# Patient Record
Sex: Female | Born: 1990 | Hispanic: No | Marital: Married | Smoking: Former smoker
Health system: Southern US, Community
[De-identification: ages and names within clinical notes are randomized; demographics above are authoritative.]

## PROBLEM LIST (undated history)

## (undated) DIAGNOSIS — A64 Unspecified sexually transmitted disease: Secondary | ICD-10-CM

## (undated) DIAGNOSIS — T783XXA Angioneurotic edema, initial encounter: Secondary | ICD-10-CM

## (undated) DIAGNOSIS — L509 Urticaria, unspecified: Secondary | ICD-10-CM

## (undated) HISTORY — PX: WISDOM TOOTH EXTRACTION: SHX21

## (undated) HISTORY — DX: Angioneurotic edema, initial encounter: T78.3XXA

## (undated) HISTORY — DX: Urticaria, unspecified: L50.9

## (undated) HISTORY — DX: Unspecified sexually transmitted disease: A64

---

## 2012-10-18 DIAGNOSIS — A64 Unspecified sexually transmitted disease: Secondary | ICD-10-CM

## 2012-10-18 HISTORY — DX: Unspecified sexually transmitted disease: A64

## 2016-12-13 ENCOUNTER — Encounter: Payer: Self-pay | Admitting: Obstetrics and Gynecology

## 2016-12-13 ENCOUNTER — Ambulatory Visit (INDEPENDENT_AMBULATORY_CARE_PROVIDER_SITE_OTHER): Payer: BLUE CROSS/BLUE SHIELD | Admitting: Obstetrics and Gynecology

## 2016-12-13 VITALS — BP 130/64 | HR 60 | Resp 14 | Ht 60.25 in | Wt 108.4 lb

## 2016-12-13 DIAGNOSIS — Z01419 Encounter for gynecological examination (general) (routine) without abnormal findings: Secondary | ICD-10-CM | POA: Diagnosis not present

## 2016-12-13 NOTE — Progress Notes (Signed)
[age] y.o. 680P0000 Married Caucasian female here for annual exam.    Menses are monthly.  No significant cramping or heavy bleeding.   Had painful intercourse on right side once, two months ago. Lasted for an hour and was very significant. Intercourse can be somewhat uncomfortable at times but not consistently.  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  Took antiviral medication in the past for a very short period of time.   Works for [employer]. Buying a house.   PCP:   Candi LeashAnthony Hayes  Patient's last menstrual period was 11/03/2016.           Sexually active: Yes.    The current method of family planning is condoms all of the time.  Satisfied with this.  Exercising: No.  The patient does not participate in regular exercise at present. Smoker:  Yes.  Would like to quit.   Health Maintenance: Pap:  Patient states she has never had one History of abnormal Pap:  n/a MMG:  never Colonoscopy:  never BMD:   never  Result  n/a TDaP:  Unsure.  Documents are at home.  Gardasil:   No.  Declines.  HIV: unsure Hep C: unsure Screening Labs: PCP Hb today: PCP   reports that she has been smoking Cigarettes.  She has never used smokeless tobacco. She reports that she drinks alcohol. She reports that she does not use drugs.  Past Medical History:  Diagnosis Date  . STD (sexually transmitted disease) 2014   Herpes- has not had outbreak    History reviewed. No pertinent surgical history.  Current Outpatient Prescriptions  Medication Sig Dispense Refill  . Cholecalciferol (VITAMIN D3 PO) Take by mouth daily. 2 gummies a day    . IRON PO Take by mouth daily.     No current facility-administered medications for this visit.     Family History  Problem Relation Age of Onset  . Diabetes Mother   . Kidney Stones Mother   . Diabetes Brother   . Bipolar disorder Maternal Aunt   . Dementia Maternal Grandmother   . Bipolar disorder Brother   . Schizophrenia Cousin     ROS:  Pertinent  items are noted in HPI.  Otherwise, a comprehensive ROS was negative.  Exam:   BP 130/64 (BP Location: Right Arm, Patient Position: Sitting, Cuff Size: Normal)   Pulse 60   Resp 14   Ht 5' 0.25" (1.53 m)   Wt 108 lb 6.4 oz (49.2 kg)   LMP 11/03/2016   BMI 21.00 kg/m     General appearance: alert, cooperative and appears stated age Head: Normocephalic, without obvious abnormality, atraumatic Neck: no adenopathy, supple, symmetrical, trachea midline and thyroid normal to inspection and palpation Lungs: clear to auscultation bilaterally Breasts: normal appearance, no masses or tenderness, No nipple retraction or dimpling, No nipple discharge or bleeding, No axillary or supraclavicular adenopathy Heart: regular rate and rhythm Abdomen: soft, non-tender; no masses, no organomegaly Extremities: extremities normal, atraumatic, no cyanosis or edema Skin: Skin color, texture, turgor normal. No rashes or lesions Lymph nodes: Cervical, supraclavicular, and axillary nodes normal. No abnormal inguinal nodes palpated Neurologic: Grossly normal  Pelvic: External genitalia:  no lesions              Urethra:  normal appearing urethra with no masses, tenderness or lesions              Bartholins and Skenes: normal  Vagina: normal appearing vagina with normal color and discharge, no lesions              Cervix: no lesions              Pap taken: Yes.   Bimanual Exam:  Uterus:  normal size, contour, position, consistency, mobility, non-tender              Adnexa: no mass, fullness, tenderness  Chaperone was present for exam.  Assessment:   Well woman visit with normal exam. Smoker.  Dyspareunia episode.  Probable ruptured ovarian cyst. Hx HSV.  Rare outbreak.  Plan: Mammogram screening discussed. Recommended self breast awareness. Pap and HR HPV as above. Guidelines for Calcium, Vitamin D, regular exercise program including cardiovascular and weight bearing  exercise. Discussed HSV.  Patient declines antiviral medication.  Smoking cessation discussed. Handout about [facility] to patient.  Follow up annually and prn.      After visit summary provided.

## 2016-12-13 NOTE — Patient Instructions (Signed)

## 2016-12-15 LAB — IPS PAP SMEAR ONLY

## 2017-11-23 ENCOUNTER — Telehealth: Payer: Self-pay | Admitting: Obstetrics and Gynecology

## 2017-11-23 NOTE — Telephone Encounter (Signed)
Left message on voicemail to call and reschedule cancelled appointment. °

## 2017-12-16 ENCOUNTER — Ambulatory Visit: Payer: BLUE CROSS/BLUE SHIELD | Admitting: Obstetrics and Gynecology

## 2017-12-23 ENCOUNTER — Ambulatory Visit (INDEPENDENT_AMBULATORY_CARE_PROVIDER_SITE_OTHER): Payer: 59 | Admitting: Obstetrics and Gynecology

## 2017-12-23 ENCOUNTER — Other Ambulatory Visit: Payer: Self-pay

## 2017-12-23 ENCOUNTER — Encounter: Payer: Self-pay | Admitting: Obstetrics and Gynecology

## 2017-12-23 VITALS — BP 100/70 | HR 88 | Resp 16 | Ht 61.5 in | Wt 107.0 lb

## 2017-12-23 DIAGNOSIS — Z01419 Encounter for gynecological examination (general) (routine) without abnormal findings: Secondary | ICD-10-CM

## 2017-12-23 NOTE — Patient Instructions (Signed)
Please check with your mother about your vaccines.  You may be due for your TDap and your Gardasil vaccines.   EXERCISE AND DIET:  We recommended that you start or continue a regular exercise program for good health. Regular exercise means any activity that makes your heart beat faster and makes you sweat.  We recommend exercising at least 30 minutes per day at least 3 days a week, preferably 4 or 5.  We also recommend a diet low in fat and sugar.  Inactivity, poor dietary choices and obesity can cause diabetes, heart attack, stroke, and kidney damage, among others.    ALCOHOL AND SMOKING:  Women should limit their alcohol intake to no more than 7 drinks/beers/glasses of wine (combined, not each!) per week. Moderation of alcohol intake to this level decreases your risk of breast cancer and liver damage. And of course, no recreational drugs are part of a healthy lifestyle.  And absolutely no smoking or even second hand smoke. Most people know smoking can cause heart and lung diseases, but did you know it also contributes to weakening of your bones? Aging of your skin?  Yellowing of your teeth and nails?  CALCIUM AND VITAMIN D:  Adequate intake of calcium and Vitamin D are recommended.  The recommendations for exact amounts of these supplements seem to change often, but generally speaking 600 mg of calcium (either carbonate or citrate) and 800 units of Vitamin D per day seems prudent. Certain women may benefit from higher intake of Vitamin D.  If you are among these women, your doctor will have told you during your visit.    PAP SMEARS:  Pap smears, to check for cervical cancer or precancers,  have traditionally been done yearly, although recent scientific advances have shown that most women can have pap smears less often.  However, every woman still should have a physical exam from her gynecologist every year. It will include a breast check, inspection of the vulva and vagina to check for abnormal growths or  skin changes, a visual exam of the cervix, and then an exam to evaluate the size and shape of the uterus and ovaries.  And after 25 years of age, a rectal exam is indicated to check for rectal cancers. We will also provide age appropriate advice regarding health maintenance, like when you should have certain vaccines, screening for sexually transmitted diseases, bone density testing, colonoscopy, mammograms, etc.   MAMMOGRAMS:  All women over 25 years old should have a yearly mammogram. Many facilities now offer a "3D" mammogram, which may cost around $50 extra out of pocket. If possible,  we recommend you accept the option to have the 3D mammogram performed.  It both reduces the number of women who will be called back for extra views which then turn out to be normal, and it is better than the routine mammogram at detecting truly abnormal areas.    COLONOSCOPY:  Colonoscopy to screen for colon cancer is recommended for all women at age 25.  We know, you hate the idea of the prep.  We agree, BUT, having colon cancer and not knowing it is worse!!  Colon cancer so often starts as a polyp that can be seen and removed at colonscopy, which can quite literally save your life!  And if your first colonoscopy is normal and you have no family history of colon cancer, most women don't have to have it again for 10 years.  Once every ten years, you can do something that  may end up saving your life, right?  We will be happy to help you get it scheduled when you are ready.  Be sure to check your insurance coverage so you understand how much it will cost.  It may be covered as a preventative service at no cost, but you should check your particular policy.

## 2017-12-23 NOTE — Progress Notes (Signed)
27 y.o. G30P0000 Married Caucasian female here for annual exam.    Menses can be painful. Uses ibuprofen which works well.   No more ovarian cysts.   Working 2 jobs.  [employer] and [employer].  Thinks it helps her OCD.   Declines blood work today.   PCP:  No PCP  Patient's last menstrual period was 12/02/2017 (within days).           Sexually active: Yes.    The current method of family planning is condoms all of the time.   Satisfied with this.   Would be ok to become pregnant. Exercising: Yes.    running, core and lower body workout,  Smoker:  Former, quit January 2019  Health Maintenance: Pap:  12/13/16 Pap smear negative History of abnormal Pap:  no TDaP:  unsure Gardasil:   unsure HIV: never Hep C: never Screening Labs:  Discuss today   reports that she quit smoking about 2 months ago. Her smoking use included cigarettes. she has never used smokeless tobacco. She reports that she drinks alcohol. She reports that she does not use drugs.  Past Medical History:  Diagnosis Date  . STD (sexually transmitted disease) 2014   Herpes- has not had outbreak    History reviewed. No pertinent surgical history.  Current Outpatient Medications  Medication Sig Dispense Refill  . Cholecalciferol (VITAMIN D3 PO) Take by mouth daily. 2 gummies a day    . Multiple Vitamin (MULTIVITAMIN) tablet Take 1 tablet by mouth daily.    . Psyllium (METAMUCIL FIBER PO) Take by mouth.     No current facility-administered medications for this visit.     Family History  Problem Relation Age of Onset  . Diabetes Mother   . Kidney Stones Mother   . Diabetes Brother   . Bipolar disorder Maternal Aunt   . Dementia Maternal Grandmother   . Bipolar disorder Brother   . Schizophrenia Cousin     ROS:  Pertinent items are noted in HPI.  Otherwise, a comprehensive ROS was negative.  Exam:   BP 100/70 (BP Location: Right Arm, Patient Position: Sitting, Cuff Size: Normal)   Pulse 88   Resp 16    Ht 5' 1.5" (1.562 m)   Wt 107 lb (48.5 kg)   LMP 12/02/2017 (Within Days)   BMI 19.89 kg/m     General appearance: alert, cooperative and appears stated age Head: Normocephalic, without obvious abnormality, atraumatic Neck: no adenopathy, supple, symmetrical, trachea midline and thyroid normal to inspection and palpation Lungs: clear to auscultation bilaterally Breasts: normal appearance, no masses or tenderness, No nipple retraction or dimpling, No nipple discharge or bleeding, No axillary or supraclavicular adenopathy Heart: regular rate and rhythm Abdomen: soft, non-tender; no masses, no organomegaly Extremities: extremities normal, atraumatic, no cyanosis or edema Skin: Skin color, texture, turgor normal. No rashes or lesions Lymph nodes: Cervical, supraclavicular, and axillary nodes normal. No abnormal inguinal nodes palpated Neurologic: Grossly normal  Pelvic: External genitalia:  no lesions              Urethra:  normal appearing urethra with no masses, tenderness or lesions              Bartholins and Skenes: normal                 Vagina: normal appearing vagina with normal color and discharge, no lesions              Cervix: no lesions  Pap taken: No. Bimanual Exam:  Uterus:  normal size, contour, position, consistency, mobility, non-tender              Adnexa: no mass, fullness, tenderness        Chaperone was present for exam.  Assessment:   Well woman visit with normal exam. Hx HSV.   Plan: Mammogram screening discussed. Recommended self breast awareness. Pap and HR HPV as above. Guidelines for Calcium, Vitamin D, regular exercise program including cardiovascular and weight bearing exercise. Start PNV. She will check on Gardasil and TDap vaccination.  Follow up annually and prn.   After visit summary provided.

## 2019-01-24 ENCOUNTER — Ambulatory Visit: Payer: 59 | Admitting: Obstetrics and Gynecology

## 2019-04-09 ENCOUNTER — Other Ambulatory Visit: Payer: Self-pay

## 2019-04-09 DIAGNOSIS — T7840XA Allergy, unspecified, initial encounter: Secondary | ICD-10-CM | POA: Diagnosis present

## 2019-04-09 DIAGNOSIS — Z79899 Other long term (current) drug therapy: Secondary | ICD-10-CM | POA: Insufficient documentation

## 2019-04-09 DIAGNOSIS — Z87891 Personal history of nicotine dependence: Secondary | ICD-10-CM | POA: Insufficient documentation

## 2019-04-09 DIAGNOSIS — L509 Urticaria, unspecified: Secondary | ICD-10-CM | POA: Insufficient documentation

## 2019-04-10 ENCOUNTER — Emergency Department (HOSPITAL_COMMUNITY)
Admission: EM | Admit: 2019-04-10 | Discharge: 2019-04-10 | Disposition: A | Discharge status: Home / Self Care | Payer: 59 | Attending: Emergency Medicine | Admitting: Emergency Medicine

## 2019-04-10 ENCOUNTER — Encounter (HOSPITAL_COMMUNITY): Payer: Self-pay | Admitting: Family Medicine

## 2019-04-10 DIAGNOSIS — L509 Urticaria, unspecified: Secondary | ICD-10-CM

## 2019-04-10 MED ORDER — FAMOTIDINE IN NACL 20-0.9 MG/50ML-% IV SOLN
20.0000 mg | Freq: Once | INTRAVENOUS | Status: DC
  Filled 2019-04-10: qty 50

## 2019-04-10 MED ORDER — METHYLPREDNISOLONE SODIUM SUCC 125 MG IJ SOLR
125.0000 mg | Freq: Once | INTRAMUSCULAR | Status: AC
  Administered 2019-04-10: 125 mg via INTRAVENOUS
  Filled 2019-04-10: qty 2

## 2019-04-10 MED ORDER — FAMOTIDINE 20 MG PO TABS: 20.0000 mg | ORAL_TABLET | Freq: Two times a day (BID) | ORAL | 0 refills | Status: DC

## 2019-04-10 MED ORDER — DIPHENHYDRAMINE HCL 50 MG/ML IJ SOLN
50.0000 mg | Freq: Once | INTRAMUSCULAR | Status: AC
  Administered 2019-04-10: 50 mg via INTRAVENOUS
  Filled 2019-04-10: qty 1

## 2019-04-10 MED ORDER — PREDNISONE 10 MG (21) PO TBPK: ORAL_TABLET | ORAL | 0 refills | Status: DC

## 2019-04-10 NOTE — ED Provider Notes (Signed)
TIME SEEN: 12:59 AM  CHIEF COMPLAINT: hives  HPI: Patient is a 28 year old female with no significant past medical history who presents to the emergency department with hives.  States she has been taking over-the-counter Benadryl and using calamine lotion without relief.  Last dose of Benadryl was at 6 PM.  No new exposures that she can recall.  No new soaps, lotions, detergents, medications, foods.  No new exposures to new animals.  States she did recently go camping with a friend.  She has no lip or tongue swelling.  No difficulty breathing.  States she has noticed some swelling of her cheeks.  ROS: See HPI Constitutional: no fever  Eyes: no drainage  ENT: no runny nose   Cardiovascular:  no chest pain  Resp: no SOB  GI: no vomiting GU: no dysuria Integumentary: no rash  Allergy: no hives  Musculoskeletal: no leg swelling  Neurological: no slurred speech ROS otherwise negative  PAST MEDICAL HISTORY/PAST SURGICAL HISTORY:  Past Medical History:  Diagnosis Date  . STD (sexually transmitted disease) 2014   Herpes- has not had outbreak    MEDICATIONS:  Prior to Admission medications   Medication Sig Start Date End Date Taking? Authorizing Provider  Cholecalciferol (VITAMIN D3 PO) Take by mouth daily. 2 gummies a day    [provider]  Multiple Vitamin (MULTIVITAMIN) tablet Take 1 tablet by mouth daily.    [provider]  Psyllium (METAMUCIL FIBER PO) Take by mouth.    [provider]    ALLERGIES:  Allergies  Allergen Reactions  . Penicillins Hives  . Pistachio Nut (Diagnostic) Itching  . Shrimp [Shellfish Allergy] Itching    SOCIAL HISTORY:  Social History   Tobacco Use  . Smoking status: Former Smoker    Types: Cigarettes    Quit date: 10/18/2017    Years since quitting: 1.4  . Smokeless tobacco: Never Used  Substance Use Topics  . Alcohol use: Yes    Comment: occasional     FAMILY HISTORY: Family History  Problem Relation Age of  Onset  . Diabetes Mother   . Kidney Stones Mother   . Diabetes Brother   . Bipolar disorder Maternal Aunt   . Dementia Maternal Grandmother   . Bipolar disorder Brother   . Schizophrenia Cousin     EXAM: BP (!) 136/95 (BP Location: Left Arm)   Pulse (!) 106   Temp 97.8 F (36.6 C) (Oral)   Resp 18   Ht 5\' 1"  (1.549 m)   Wt 49.9 kg   LMP 04/07/2019   SpO2 100%   BMI 20.78 kg/m  CONSTITUTIONAL: Alert and oriented and responds appropriately to questions.  Patient appears uncomfortable and anxious HEAD: Normocephalic EYES: Conjunctivae clear, pupils appear equal, EOMI ENT: normal nose; moist mucous membranes, no angioedema, patent airway, normal speech, no stridor, no trismus or drooling NECK: Supple, no meningismus, no nuchal rigidity, no LAD  CARD: Regular and minimally tachycardic; S1 and S2 appreciated; no murmurs, no clicks, no rubs, no gallops RESP: Normal chest excursion without splinting or tachypnea; breath sounds clear and equal bilaterally; no wheezes, no rhonchi, no rales, no hypoxia or respiratory distress, speaking full sentences ABD/GI: Normal bowel sounds; non-distended; soft, non-tender, no rebound, no guarding, no peritoneal signs, no hepatosplenomegaly BACK:  The back appears normal and is non-tender to palpation, there is no CVA tenderness EXT: Normal ROM in all joints; non-tender to palpation; no edema; normal capillary refill; no cyanosis, no calf tenderness or swelling  SKIN: Normal color for age and race; warm; diffuse urticaria, no petechia or purpura, no blisters or desquamation, no rash on her mucous membranes or palms or soles NEURO: Moves all extremities equally PSYCH: The patient's mood and manner are appropriate. Grooming and personal hygiene are appropriate.  MEDICAL DECISION MAKING: Patient here with urticaria.  Will give IV Benadryl, Solu-Medrol, Pepcid.  I do not feel that she needs epinephrine at this time.  Will monitor closely in the ED.  She  has no airway involvement.  She is normotensive.  ED PROGRESS: 2:00 AM  Pt feeling her heart was racing after receiving IV medications.  She is still minimally tachycardic but incredibly anxious.  Her urticaria are improving and there is no airway involvement.  Will obtain EKG and to new to monitor closely.  3:15 AM  Pt's hives have resolved.  She reports feeling much better.  Her EKG is normal.  Heart rate now in the 80s.  I feel she is safe to be discharged home.  Instructed her to continue to use Benadryl.  Will discharge with prednisone taper and Pepcid.  Will provide work note.  Will provide follow-up with allergy specialist.   At this time, I do not feel there is any life-threatening condition present. I have reviewed and discussed all results (EKG, imaging, lab, urine as appropriate) and exam findings with patient/family. I have reviewed nursing notes and appropriate previous records.  I feel the patient is safe to be discharged home without further emergent workup and can continue workup as an outpatient as needed. Discussed usual and customary return precautions. Patient/family verbalize understanding and are comfortable with this plan.  Outpatient follow-up has been provided as needed. All questions have been answered.   EKG Interpretation  Date/Time:  Tuesday April 10 2019 02:15:16 EDT Ventricular Rate:  87 PR Interval:    QRS Duration: 81 QT Interval:  360 QTC Calculation: 433 R Axis:   77 Text Interpretation:  Sinus rhythm No old tracing to compare Confirmed by Ward, Cyril Mourning [phone]) on 04/10/2019 2:35:38 AM           Ward, Delice Bison, DO 04/10/19 9030

## 2019-04-10 NOTE — Discharge Instructions (Signed)
I recommend that you continue to use Benadryl 50 mg as needed for rash, itching every 6 hours for the next several days.

## 2019-04-10 NOTE — ED Triage Notes (Signed)
Patient is experiencing an allergic reaction to unknown agent. Patient states she woke up with rash all over body. She currently has calamine lotion on and took Benadryl multiple times today. She itching and very uncomfortable.

## 2019-04-16 ENCOUNTER — Other Ambulatory Visit: Payer: Self-pay

## 2019-04-18 ENCOUNTER — Ambulatory Visit: Payer: 59 | Admitting: Obstetrics and Gynecology

## 2019-04-18 ENCOUNTER — Encounter: Payer: Self-pay | Admitting: Obstetrics and Gynecology

## 2019-04-18 ENCOUNTER — Other Ambulatory Visit: Payer: Self-pay

## 2019-04-18 VITALS — BP 108/60 | HR 72 | Temp 98.1°F | Resp 12 | Ht 61.5 in | Wt 107.0 lb

## 2019-04-18 DIAGNOSIS — Z113 Encounter for screening for infections with a predominantly sexual mode of transmission: Secondary | ICD-10-CM | POA: Diagnosis not present

## 2019-04-18 DIAGNOSIS — Z3169 Encounter for other general counseling and advice on procreation: Secondary | ICD-10-CM | POA: Diagnosis not present

## 2019-04-18 DIAGNOSIS — Z01419 Encounter for gynecological examination (general) (routine) without abnormal findings: Secondary | ICD-10-CM | POA: Diagnosis not present

## 2019-04-18 NOTE — Progress Notes (Signed)
28 y.o. 230P0000 Married Caucasian female here for annual exam.    Menses are regular.  No change in partner.  Considering a family.  Wants to do blood work to see if she really has HSV.  She does not want to do this today.   Working at [employer].  PCP: No PCP    Patient's last menstrual period was 03/29/2019.           Sexually active: Yes.    The current method of family planning is condoms most of the time.    Exercising: No.  The patient does not participate in regular exercise at present. Smoker:  Former, quit January 2019  Health Maintenance: Pap:  12/13/16 Pap smear negative History of abnormal Pap:  no TDaP:  Unsure,  Gardasil:   no HIV: none Hep C: none Screening Labs:  Discuss today   reports that she quit smoking about 17 months ago. Her smoking use included cigarettes. She has never used smokeless tobacco. She reports current alcohol use. She reports that she does not use drugs.  Past Medical History:  Diagnosis Date  . STD (sexually transmitted disease) 2014   Herpes- has not had outbreak    History reviewed. No pertinent surgical history.  Current Outpatient Medications  Medication Sig Dispense Refill  . diphenhydrAMINE (BENADRYL) 25 MG tablet Take 25 mg by mouth every 6 (six) hours as needed for itching or allergies.    . famotidine (PEPCID) 20 MG tablet Take 1 tablet (20 mg total) by mouth 2 (two) times daily. (Patient not taking: Reported on 04/18/2019) 10 tablet 0  . ibuprofen (ADVIL) 200 MG tablet Take 600 mg by mouth every 6 (six) hours as needed for moderate pain.     No current facility-administered medications for this visit.     Family History  Problem Relation Age of Onset  . Diabetes Mother   . Kidney Stones Mother   . Diabetes Brother   . Bipolar disorder Maternal Aunt   . Dementia Maternal Grandmother   . Bipolar disorder Brother   . Schizophrenia Cousin     Review of Systems  Constitutional: Negative.   HENT: Negative.   Eyes:  Negative.   Respiratory: Negative.   Cardiovascular: Negative.   Gastrointestinal: Negative.   Endocrine: Negative.   Genitourinary: Negative.   Musculoskeletal: Negative.   Skin: Negative.   Allergic/Immunologic: Negative.   Neurological: Negative.   Hematological: Negative.   Psychiatric/Behavioral: Negative.     Exam:   BP 108/60 (BP Location: Right Arm, Patient Position: Sitting, Cuff Size: Normal)   Pulse 72   Temp 98.1 F (36.7 C) (Temporal)   Resp 12   Ht 5' 1.5" (1.562 m)   Wt 107 lb (48.5 kg)   LMP 03/29/2019   BMI 19.89 kg/m     General appearance: alert, cooperative and appears stated age Head: normocephalic, without obvious abnormality, atraumatic Neck: no adenopathy, supple, symmetrical, trachea midline and thyroid normal to inspection and palpation Lungs: clear to auscultation bilaterally Breasts: normal appearance, no masses or tenderness, No nipple retraction or dimpling, No nipple discharge or bleeding, No axillary adenopathy Heart: regular rate and rhythm Abdomen: soft, non-tender; no masses, no organomegaly Extremities: extremities normal, atraumatic, no cyanosis or edema Skin: skin color, texture, turgor normal. No rashes or lesions Lymph nodes: cervical, supraclavicular, and axillary nodes normal. Neurologic: grossly normal  Pelvic: External genitalia:  no lesions              No abnormal inguinal nodes  palpated.              Urethra:  normal appearing urethra with no masses, tenderness or lesions              Bartholins and Skenes: normal                 Vagina: normal appearing vagina with normal color and discharge, no lesions              Cervix: no lesions              Pap taken: No. Bimanual Exam:  Uterus:  normal size, contour, position, consistency, mobility, non-tender              Adnexa: no mass, fullness, tenderness             Chaperone was present for exam.  Assessment:   Well woman visit with normal exam. Hx HSV.  Preconception  counseling.   Plan: Mammogram screening discussed. Self breast awareness reviewed. Pap and HR HPV as above. Guidelines for Calcium, Vitamin D, regular exercise program including cardiovascular and weight bearing exercise. Return for Rubella titer and HSV I and II IgG. Start PNV.  We discussed avoiding exposures - ETOH, tobacco, unnecessary medications, uncooked or unprepared foods.  Follow up annually and prn.   After visit summary provided.

## 2019-04-18 NOTE — Patient Instructions (Signed)

## 2019-04-19 ENCOUNTER — Encounter: Payer: Self-pay | Admitting: Allergy

## 2019-04-19 ENCOUNTER — Ambulatory Visit: Payer: 59 | Admitting: Allergy

## 2019-04-19 VITALS — BP 98/66 | HR 75 | Temp 98.7°F | Resp 18 | Ht 62.25 in | Wt 109.2 lb

## 2019-04-19 DIAGNOSIS — T781XXD Other adverse food reactions, not elsewhere classified, subsequent encounter: Secondary | ICD-10-CM

## 2019-04-19 DIAGNOSIS — L508 Other urticaria: Secondary | ICD-10-CM

## 2019-04-19 DIAGNOSIS — J3089 Other allergic rhinitis: Secondary | ICD-10-CM

## 2019-04-19 DIAGNOSIS — H1013 Acute atopic conjunctivitis, bilateral: Secondary | ICD-10-CM | POA: Diagnosis not present

## 2019-04-19 MED ORDER — EPINEPHRINE 0.3 MG/0.3ML IJ SOAJ: 0.3000 mg | Freq: Once | INTRAMUSCULAR | 2 refills | Status: AC

## 2019-04-19 NOTE — Patient Instructions (Addendum)
Hives, acute  - at this time etiology of hives and swelling is unknown.  Hives can be caused by a variety of different triggers including illness/infection, foods, medications, stings, exercise, pressure, vibrations, extremes of temperature to name a few however majority of the time there is no identifiable trigger.  Your symptoms have been ongoing for less than 6 weeks.  If hives continue and last longer than 6 weeks (chronic) then recommend obtaining lab work.    - if hives return recommend starting the following high-dose antihistamine regimen: Zyrtec 10mg  and Pepcid 20mg  daily.   If hives continue then increase to Zyrtec and Pepcid twice a day dosing.  Let us know if you continue to have hives despite this regimen  - if hives return a journal is to be kept recording any foods eaten, beverages consumed, medications taken, activities performed, and environmental conditions within a 6 hour time period prior to the onset of symptoms.  - environmental allergy skin testing is positive to grass pollens, weed pollens, tree pollens, molds, dust mites, cat, cockroach  - common food allergy skin testing is positive to soybean, sesame, walnut, hazelnut and shrimp.     Seasonal allergies  - environmental allergy skin testing as above.  Allergen avoidance measures provided  - for allergy symptoms relief Zyrtec as needed  - for itchy/watery/red eyes recommend over-the-counter Pataday 1 drop each eye daily as needed  - for nasal congestion/drainage can use over-the-counter Flonase, Rhinocort or Nasacort 2 sprays each nostril daily as needed for 1-2 weeks at a time  - if medication management is ineffective in controlling allergy symptoms consider allergen immunotherapy (allergy shots)  Adverse food reaction  - food skin testing today as above  - continue avoidance of shellfish, tree nuts and avocado.   You eat sesame and soybean products without any symptoms then you are SENSITIZED only and not allergic.  You  can continue sesame and soybean products in the diet but if you notice any reactions following ingestion then avoid in diet.  Avocado is negative on skin testing thus recommend obtain serum IgE level to determine if you can eat in the diet in future.    - have access to self-injectable epinephrine (Epipen or AuviQ) 0.3mg  at all times  - follow emergency action plan in case of allergic reaction  Follow-up 4-6 months or sooner if needed

## 2019-04-19 NOTE — Progress Notes (Signed)
New Patient Note  RE: Angela Taylor MRN: [id number] DOB: 17-Oct-1991 Date of Office Visit: 04/19/2019  Referring provider: No ref. provider found Primary care provider: Patient, No Pcp Per  Chief Complaint: hives  History of present illness: Angela Taylor is a 28 y.o. female presenting today for evaluation of hives.  She is looking for a PCP at this time.  Hives started about 2 weeks ago and lasted for 3 days.  She states she has had flare-up of hives in the past that was managed with benadryl and resolved quickly.  She states she woke up and noticed hives all over her stomach.  Throughout the day the hives started popping up any other body areas.  She states she was not able to function at work due to the itch.  She tried some OTC "powdery" product she put on her skin and also tried calamine.  Nothing helped so she went to the ED.   She reports her face was swollen and her legs were swollen.  No joint aches/pains, fevers and hives did not leave behind bruising.   She does report red meat in the diet.  She states she did visit with a friend who had just recently been camping prior to onset of hives.   No change in medications, no new foods, no stings/bites, no change in soaps/lotions/detergents.  No preceding illnesses.  ED visit 04/10/19 for hives and was treated with IV benadryl, solumedrol and pepcid.  She had an EKG after developing tachycardia from the medications.  EKG was normal.  She was discharged to completed a prednisone taper and pepcid.  She states she did not complete the prednisone but did take the pepcid and is no long taking pepcid.       She does report in the spring she gets watery eyes, itchy throat, itchy ears, runny nose.  She states she typically will suffer through the symptoms initially but if symptoms persist will take OTC antihistamines.    Pistachio, avocado and shellfish causes her to have throat itchiness and tightness.  She states she would eat shrimp without  issue but last year while at restaurant eating shrimp noted the throat itchiness and throat tightness.  She state she does not like cashew or walnuts but able to eat pecan, macadamia nuts with out.   No history of eczema.  She reports having to use a nebulizer that her mother has only twice for bronchitis episodes.    Review of systems: Review of Systems  Constitutional: Negative for chills, fever and malaise/fatigue.  HENT: Negative for congestion, ear discharge, nosebleeds and sore throat.   Eyes: Negative for pain, discharge and redness.  Respiratory: Negative for cough, shortness of breath and wheezing.   Cardiovascular: Negative for chest pain.  Gastrointestinal: Negative for abdominal pain, constipation, diarrhea, heartburn, nausea and vomiting.  Musculoskeletal: Negative for joint pain.  Skin: Positive for itching and rash.  Neurological: Negative for headaches.    All other systems negative unless noted above in HPI  Past medical history: Past Medical History:  Diagnosis Date   Angio-edema    STD (sexually transmitted disease) 2014   Herpes- has not had outbreak   Urticaria     Past surgical history: History reviewed. No pertinent surgical history.  Family history:  Family History  Problem Relation Age of Onset   Diabetes Mother    Kidney Stones Mother    Asthma Mother    Allergic rhinitis Mother    Diabetes Brother  Allergic rhinitis Brother    Asthma Brother    Bipolar disorder Maternal Aunt    Dementia Maternal Grandmother    Bipolar disorder Brother    Allergic rhinitis Brother    Asthma Brother    Schizophrenia Cousin     Social history: Lives in a home with carpeting with gas and central cooling.  2 dogs in the home.  There is no concern for water damage, mildew or roaches in the home.  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   Tobacco Use   Smoking status: Former Smoker    Types: Cigarettes    Quit date: 10/18/2017    Years since  quitting: 1.5   Smokeless tobacco: Never Used    Medication List: Allergies as of 04/19/2019      Reactions   Penicillins Hives   Did it involve swelling of the face/tongue/throat, SOB, or low BP? No Did it involve sudden or severe rash/hives, skin peeling, or any reaction on the inside of your mouth or nose? No Did you need to seek medical attention at a hospital or doctor's office? No When did it last happen? If all above answers are NO, may proceed with cephalosporin use.   Pistachio Nut (diagnostic) Itching   Shrimp [shellfish Allergy] Itching      Medication List       Accurate as of April 19, 2019  1:16 PM. If you have any questions, ask your nurse or doctor.        STOP taking these medications   famotidine 20 MG tablet Commonly known as: PEPCID Stopped by: Nataki Mccrumb Larose HiresPatricia Marcy Bogosian, MD     TAKE these medications   diphenhydrAMINE 25 MG tablet Commonly known as: BENADRYL Take 25 mg by mouth every 6 (six) hours as needed for itching or allergies.   EPINEPHrine 0.3 mg/0.3 mL Soaj injection Commonly known as: Auvi-Q Inject 0.3 mLs (0.3 mg total) into the muscle once for 1 dose. As directed for life-threatening allergic reactions Started by: Dylin Breeden Larose HiresPatricia Denia Mcvicar, MD   ibuprofen 200 MG tablet Commonly known as: ADVIL Take 600 mg by mouth every 6 (six) hours as needed for moderate pain.   loratadine 10 MG tablet Commonly known as: CLARITIN Take 10 mg by mouth daily.       Known medication allergies: Allergies  Allergen Reactions   Penicillins Hives    Did it involve swelling of the face/tongue/throat, SOB, or low BP? No Did it involve sudden or severe rash/hives, skin peeling, or any reaction on the inside of your mouth or nose? No Did you need to seek medical attention at a hospital or doctor's office? No When did it last happen? If all above answers are NO, may proceed with cephalosporin use.    Pistachio Nut (Diagnostic) Itching    Shrimp [Shellfish Allergy] Itching     Physical examination: Blood pressure 98/66, pulse 75, temperature 98.7 F (37.1 C), temperature source Temporal, resp. rate 18, height 5' 2.25" (1.581 m), weight 109 lb 3.2 oz (49.5 kg), last menstrual period 04/07/2019, SpO2 99 %.  General: Alert, interactive, in no acute distress. HEENT: PERRLA, TMs pearly gray, turbinates moderately edematous without discharge, post-pharynx non erythematous. Neck: Supple without lymphadenopathy. Lungs: Clear to auscultation without wheezing, rhonchi or rales. {no increased work of breathing. CV: Normal S1, S2 without murmurs. Abdomen: Nondistended, nontender. Skin: Warm and dry, without lesions or rashes. Extremities:  No clubbing, cyanosis or edema. Neuro:   Grossly intact.  Diagnositics/Labs:  Allergy testing: environmental allergy  skin prick testing is positive to grass pollens, weed pollens, tree pollens, mucor plumbeus, rhizopus oryzae, dust mites, cat and cockroach. Food allergy skin prick testing is positive to soybean, sesame, walnut, hazelnut, shrimp Allergy testing results were read and interpreted by provider, documented by clinical staff.   Assessment and plan: Acute urticaria  - at this time etiology of hives and swelling is unknown.  Hives can be caused by a variety of different triggers including illness/infection, foods, medications, stings, exercise, pressure, vibrations, extremes of temperature to name a few however majority of the time there is no identifiable trigger.  Your symptoms have been ongoing for less than 6 weeks.  If hives continue and last longer than 6 weeks (chronic) then recommend obtaining lab work.    - if hives return recommend starting the following high-dose antihistamine regimen: Zyrtec 10mg  and Pepcid 20mg  daily.   If hives continue then increase to Zyrtec and Pepcid twice a day dosing.  Let us know if you continue to have hives despite this regimen  - if hives return a  journal is to be kept recording any foods eaten, beverages consumed, medications taken, activities performed, and environmental conditions within a 6 hour time period prior to the onset of symptoms.  - environmental allergy skin testing is positive to grass pollens, weed pollens, tree pollens, molds, dust mites, cat, cockroach  - common food allergy skin testing is positive to soybean, sesame, walnut, hazelnut and shrimp.     Allergic rhinitis with conjunctivitis  - environmental allergy skin testing as above.  Allergen avoidance measures provided  - for allergy symptoms relief Zyrtec as needed  - for itchy/watery/red eyes recommend over-the-counter Pataday 1 drop each eye daily as needed  - for nasal congestion/drainage can use over-the-counter Flonase, Rhinocort or Nasacort 2 sprays each nostril daily as needed for 1-2 weeks at a time  - if medication management is ineffective in controlling allergy symptoms consider allergen immunotherapy (allergy shots)  Adverse food reaction  - food skin testing today as above  - continue avoidance of shellfish, tree nuts and avocado.   You eat sesame and soybean products without any symptoms then you are sensitized only and not allergic.  You can continue sesame and soybean products in the diet but if you notice any reactions following ingestion then avoid in diet.  Avocado is negative on skin testing thus recommend obtain serum IgE level to determine if you can eat in the diet in future.    - have access to self-injectable epinephrine (Epipen or AuviQ) 0.3mg  at all times  - follow emergency action plan in case of allergic reaction  Follow-up 4-6 months or sooner if needed  I appreciate the opportunity to take part in Persephanie's care. Please do not hesitate to contact me with questions.  Sincerely,   Prudy Feeler, MD Allergy/Immunology [facility] [place]

## 2019-06-20 ENCOUNTER — Other Ambulatory Visit: Payer: Self-pay

## 2019-06-20 ENCOUNTER — Encounter (HOSPITAL_COMMUNITY): Payer: Self-pay | Admitting: *Deleted

## 2019-06-20 ENCOUNTER — Inpatient Hospital Stay (HOSPITAL_COMMUNITY): Payer: Self-pay

## 2019-06-20 ENCOUNTER — Inpatient Hospital Stay (HOSPITAL_COMMUNITY)
Admission: AD | Admit: 2019-06-20 | Discharge: 2019-06-20 | Disposition: A | Discharge status: Home / Self Care | Payer: Self-pay | Attending: Family Medicine | Admitting: Family Medicine

## 2019-06-20 ENCOUNTER — Telehealth: Payer: Self-pay | Admitting: *Deleted

## 2019-06-20 DIAGNOSIS — O26899 Other specified pregnancy related conditions, unspecified trimester: Secondary | ICD-10-CM

## 2019-06-20 DIAGNOSIS — O26891 Other specified pregnancy related conditions, first trimester: Secondary | ICD-10-CM

## 2019-06-20 DIAGNOSIS — Z87891 Personal history of nicotine dependence: Secondary | ICD-10-CM | POA: Insufficient documentation

## 2019-06-20 DIAGNOSIS — Z3A01 Less than 8 weeks gestation of pregnancy: Secondary | ICD-10-CM | POA: Insufficient documentation

## 2019-06-20 DIAGNOSIS — Z880 Allergy status to penicillin: Secondary | ICD-10-CM | POA: Insufficient documentation

## 2019-06-20 DIAGNOSIS — R109 Unspecified abdominal pain: Secondary | ICD-10-CM

## 2019-06-20 DIAGNOSIS — O209 Hemorrhage in early pregnancy, unspecified: Secondary | ICD-10-CM | POA: Insufficient documentation

## 2019-06-20 LAB — URINALYSIS, MICROSCOPIC (REFLEX): RBC / HPF: >50 RBC/hpf (ref 0–5)

## 2019-06-20 LAB — CBC WITH DIFFERENTIAL/PLATELET
Abs Immature Granulocytes: 0.02 10*3/uL (ref 0.00–0.07)
Basophils Absolute: 0 10*3/uL (ref 0.0–0.1)
Basophils Relative: 0 %
Eosinophils Absolute: 0.2 10*3/uL (ref 0.0–0.5)
Eosinophils Relative: 2 %
HCT: 38.9 % (ref 36.0–46.0)
Hemoglobin: 13 g/dL (ref 12.0–15.0)
Immature Granulocytes: 0 %
Lymphocytes Relative: 24 %
Lymphs Abs: 1.8 10*3/uL (ref 0.7–4.0)
MCH: 31.3 pg (ref 26.0–34.0)
MCHC: 33.4 g/dL (ref 30.0–36.0)
MCV: 93.5 fL (ref 80.0–100.0)
Monocytes Absolute: 0.4 10*3/uL (ref 0.1–1.0)
Monocytes Relative: 5 %
Neutro Abs: 5 10*3/uL (ref 1.7–7.7)
Neutrophils Relative %: 69 %
Platelets: 280 10*3/uL (ref 150–400)
RBC: 4.16 MIL/uL (ref 3.87–5.11)
RDW: 12.5 % (ref 11.5–15.5)
WBC: 7.4 10*3/uL (ref 4.0–10.5)
nRBC: 0 % (ref 0.0–0.2)

## 2019-06-20 LAB — URINALYSIS, ROUTINE W REFLEX MICROSCOPIC
Bilirubin Urine: NEGATIVE
Glucose, UA: NEGATIVE mg/dL
Ketones, ur: NEGATIVE mg/dL
Nitrite: NEGATIVE
Protein, ur: 100 mg/dL — AB
Specific Gravity, Urine: <1.005 — ABNORMAL LOW (ref 1.005–1.030)
pH: 6.5 (ref 5.0–8.0)

## 2019-06-20 LAB — POCT PREGNANCY, URINE: Preg Test, Ur: POSITIVE — AB

## 2019-06-20 LAB — HCG, QUANTITATIVE, PREGNANCY: hCG, Beta Chain, Quant, S: 8114 m[IU]/mL — ABNORMAL HIGH (ref <5)

## 2019-06-20 LAB — ABO/RH: ABO/RH(D): B POS

## 2019-06-20 MED ORDER — OXYCODONE-ACETAMINOPHEN 5-325 MG PO TABS: 1.0000 | ORAL_TABLET | Freq: Four times a day (QID) | ORAL | 0 refills | Status: DC | PRN

## 2019-06-20 NOTE — MAU Note (Signed)
.   Angela Taylor is a 28 y.o. at [redacted]w[redacted]d here in MAU reporting: lower abdominal and back pain for three days. PT states she started having vaginal spotting and now she states she is having vaginal bleeding like a period.  LMP: 05/02/19 Onset of complaint: 3 days Pain score: 8 Vitals:   06/20/19 1301  BP: 118/74  Pulse: 78  Resp: 16  Temp: 98.5 F (36.9 C)  SpO2: 100%     FHT: Lab orders placed from triage: UA/UPT

## 2019-06-20 NOTE — Discharge Instructions (Signed)
Threatened Miscarriage ° °A threatened miscarriage is when you have bleeding from your vagina during the first 20 weeks of pregnancy but the pregnancy does not end. Your doctor will do tests to make sure you are still pregnant. The cause of the bleeding may not be known. This condition does not mean your pregnancy will end, but it does increase the risk that it will end (complete miscarriage). °Follow these instructions at home: °· Get plenty of rest. °· If you have bleeding in your vagina, do not have sex or use tampons. °· Do not douche. °· Do not smoke or use drugs. °· Do not drink alcohol. °· Avoid caffeine. °· Keep all follow-up prenatal visits as told by your doctor. This is important. °Contact a doctor if: °· You have light bleeding from your vagina. °· You have belly pain or cramping. °· You have a fever. °Get help right away if: °· You have heavy bleeding from your vagina. °· You have clots of blood coming from your vagina. °· You pass tissue from your vagina. °· You have a gush of fluid from your vagina. °· You are leaking fluid from your vagina. °· You have very bad pain or cramps in your low back or belly. °· You have fever, chills, and very bad belly pain. °Summary °· A threatened miscarriage is when you have bleeding from your vagina during the first 20 weeks of pregnancy but the pregnancy does not end. °· This condition does not mean your pregnancy will end, but it does increase the risk that it will end (complete miscarriage). °· Get plenty of rest. If you have bleeding in your vagina, do not have sex or use tampons. °· Keep all follow-up prenatal visits as told by your doctor. This is important. °This information is not intended to replace advice given to you by your health care provider. Make sure you discuss any questions you have with your health care provider. °Document Released: 09/16/2008 Document Revised: 11/10/2017 Document Reviewed: 12/31/2016 °Elsevier Patient Education © 2020 Elsevier  Inc. ° °

## 2019-06-20 NOTE — Telephone Encounter (Signed)
Received a phone call transferred from registar. Elenor states she has done 3 home pregnancy tests all positive. States she started bleeding on 06/18/19 and it has been enough to wear a pantiliner. States it been brown/black to red now.  States she started having pain yesterday that was mild to moderate but today it is worse and is intolerable- needs pain medicine. I advised her to go to [facility] for evaluation for possible ectopic pregnancy. She voices understanding. Bunyan Brier,RN

## 2019-06-20 NOTE — MAU Provider Note (Signed)
History     CSN: [id number]  Arrival date and time: 06/20/19 1237   First Provider Initiated Contact with Patient 06/20/19 1328      Chief Complaint  Patient presents with  . Possible Pregnancy  . Abdominal Pain  . Vaginal Bleeding   HPI [name] is a 28 y.o. G1P0000 at [redacted]w[redacted]d who presents to MAU today with complaint of vaginal bleeding and abdominal pain. She states that she started with brown light bleeding 3 days ago. Today it became red with clots and heavier than previously. She has also had associated abdominal pain rated at 8/10. She has not taken anything for pain. She denies fever, vomiting, diarrhea or UTI symptoms. She has had occasional nausea.   OB History    Gravida  1   Para  0   Term  0   Preterm  0   AB  0   Living  0     SAB  0   TAB  0   Ectopic  0   Multiple  0   Live Births  0           Past Medical History:  Diagnosis Date  . Angio-edema   . STD (sexually transmitted disease) 2014   Herpes- has not had outbreak  . Urticaria     History reviewed. No pertinent surgical history.  Family History  Problem Relation Age of Onset  . Diabetes Mother   . Kidney Stones Mother   . Asthma Mother   . Allergic rhinitis Mother   . Diabetes Brother   . Allergic rhinitis Brother   . Asthma Brother   . Bipolar disorder Maternal Aunt   . Dementia Maternal Grandmother   . Bipolar disorder Brother   . Allergic rhinitis Brother   . Asthma Brother   . Schizophrenia Cousin     Social History   Tobacco Use  . Smoking status: Former Smoker    Types: Cigarettes    Quit date: 10/18/2017    Years since quitting: 1.6  . Smokeless tobacco: Never Used  Substance Use Topics  . Alcohol use: Yes    Comment: occasional   . Drug use: No    Allergies:  Allergies  Allergen Reactions  . Penicillins Hives    Did it involve swelling of the face/tongue/throat, SOB, or low BP? No Did it involve sudden or severe rash/hives, skin peeling, or  any reaction on the inside of your mouth or nose? No Did you need to seek medical attention at a hospital or doctor's office? No When did it last happen? If all above answers are "NO", may proceed with cephalosporin use.   Marland Kitchen Pistachio Nut (Diagnostic) Itching  . Shrimp [Shellfish Allergy] Itching    No medications prior to admission.    Review of Systems  Constitutional: Negative for fever.  Gastrointestinal: Positive for abdominal pain and nausea. Negative for constipation, diarrhea and vomiting.  Genitourinary: Positive for vaginal bleeding. Negative for dysuria, frequency, urgency and vaginal discharge.   Physical Exam   Blood pressure 113/78, pulse 72, temperature 98.5 F (36.9 C), resp. rate 16, height 5\' 1"  (1.549 m), weight 52.6 kg, last menstrual period 05/02/2019, SpO2 100 %.  Physical Exam  Nursing note and vitals reviewed. Constitutional: She is oriented to person, place, and time. She appears well-developed and well-nourished. No distress.  HENT:  Head: Normocephalic and atraumatic.  Cardiovascular: Normal rate.  Respiratory: Effort normal.  GI: Soft. She exhibits no distension and no mass.  There is abdominal tenderness (mild diffuse, lower abdomen). There is no rebound and no guarding.  Genitourinary: Cervix exhibits no motion tenderness, no discharge and no friability.    Vaginal bleeding (small) present.     No vaginal discharge.  There is bleeding (small) in the vagina.    Genitourinary Comments: Possible POC resembling GS removed from vagina. Additional tissue removed from cervix with ring forceps. Minimal bleeding afterwards.    Neurological: She is alert and oriented to person, place, and time.  Skin: Skin is warm and dry. No erythema.  Psychiatric: She has a normal mood and affect.    Results for orders placed or performed during the hospital encounter of 06/20/19 (from the past 24 hour(s))  Urinalysis, Routine w reflex microscopic     Status:  Abnormal   Collection Time: 06/20/19 12:56 PM  Result Value Ref Range   Color, Urine RED (A) YELLOW   APPearance HAZY (A) CLEAR   Specific Gravity, Urine <1.005 (L) 1.005 - 1.030   pH 6.5 5.0 - 8.0   Glucose, UA NEGATIVE NEGATIVE mg/dL   Hgb urine dipstick LARGE (A) NEGATIVE   Bilirubin Urine NEGATIVE NEGATIVE   Ketones, ur NEGATIVE NEGATIVE mg/dL   Protein, ur 161100 (A) NEGATIVE mg/dL   Nitrite NEGATIVE NEGATIVE   Leukocytes,Ua TRACE (A) NEGATIVE  Urinalysis, Microscopic (reflex)     Status: Abnormal   Collection Time: 06/20/19 12:56 PM  Result Value Ref Range   RBC / HPF >50 0 - 5 RBC/hpf   WBC, UA 0-5 0 - 5 WBC/hpf   Bacteria, UA FEW (A) NONE SEEN   Squamous Epithelial / LPF 0-5 0 - 5  Pregnancy, urine POC     Status: Abnormal   Collection Time: 06/20/19 12:57 PM  Result Value Ref Range   Preg Test, Ur POSITIVE (A) NEGATIVE  CBC with Differential/Platelet     Status: None   Collection Time: 06/20/19  2:07 PM  Result Value Ref Range   WBC 7.4 4.0 - 10.5 K/uL   RBC 4.16 3.87 - 5.11 MIL/uL   Hemoglobin 13.0 12.0 - 15.0 g/dL   HCT 09.638.9 04.536.0 - 40.946.0 %   MCV 93.5 80.0 - 100.0 fL   MCH 31.3 26.0 - 34.0 pg   MCHC 33.4 30.0 - 36.0 g/dL   RDW 81.112.5 91.411.5 - 78.215.5 %   Platelets 280 150 - 400 K/uL   nRBC 0.0 0.0 - 0.2 %   Neutrophils Relative % 69 %   Neutro Abs 5.0 1.7 - 7.7 K/uL   Lymphocytes Relative 24 %   Lymphs Abs 1.8 0.7 - 4.0 K/uL   Monocytes Relative 5 %   Monocytes Absolute 0.4 0.1 - 1.0 K/uL   Eosinophils Relative 2 %   Eosinophils Absolute 0.2 0.0 - 0.5 K/uL   Basophils Relative 0 %   Basophils Absolute 0.0 0.0 - 0.1 K/uL   Immature Granulocytes 0 %   Abs Immature Granulocytes 0.02 0.00 - 0.07 K/uL  ABO/Rh     Status: None   Collection Time: 06/20/19  2:07 PM  Result Value Ref Range   ABO/RH(D) B POS    No rh immune globuloin      NOT A RH IMMUNE GLOBULIN CANDIDATE, PT RH POSITIVE Performed at [facility], [address] [place], [place] [address]   hCG,  quantitative, pregnancy     Status: Abnormal   Collection Time: 06/20/19  2:07 PM  Result Value Ref Range   hCG, Beta Chain,  Quant, S 8,114 (H) <5 mIU/mL   US Ob Comp Less 14 Wks  Result Date: 06/20/2019 CLINICAL DATA:  Heavy vaginal bleeding.  Pelvic pain. EXAM: OBSTETRIC <14 WK ULTRASOUND TECHNIQUE: Transabdominal ultrasound was performed for evaluation of the gestation as well as the maternal uterus and adnexal regions. Patient declined transvaginal ultrasound. COMPARISON:  None. FINDINGS: Intrauterine gestational sac: None visualized by transabdominal sonography Maternal uterus/adnexae: No fibroids identified. Both ovaries are normal in appearance. No mass or abnormal free fluid identified by transabdominal approach. IMPRESSION: Pregnancy of unknown anatomic location (no intrauterine gestational sac or adnexal mass identified by transabdominal ultrasound). Differential diagnosis includes recent spontaneous abortion, IUP too early to visualize, and non-visualized ectopic pregnancy. Recommend correlation with serial beta-hCG levels, and follow up US if warranted clinically. Electronically Signed   By: Marlaine Hind M.D.   On: 06/20/2019 14:37    MAU Course  Procedures None  MDM +UPT UA, CBC, ABO/Rh, quant hCG, HIV, RPR and [place] today to rule out ectopic pregnancy Given pelvic exam and likely POC removed vaginally, this is most likely a miscarriage, however without comparable [place] or hCG information from a prior visit, will need to repeat hCG in 48 hours to ensure we can confirm the diagnosis.  Patient and mother advised of likelihood of miscarriage and plan to follow-up on Friday morning for repeat hCG at [facility].  Assessment and Plan  A: Vaginal bleeding in pregnancy, first trimester   P: Discharge home Rx for Percocet sent to patient's pharmacy  Bleeding/ectopic precautions discussed Patient advised to follow-up with [facility] on Friday at 8:30 am for repeat hCG  Patient may return to MAU  as needed or if her condition were to change or worsen   Kerry Hough, PA-C 06/20/2019, 7:40 PM

## 2019-06-21 ENCOUNTER — Telehealth: Payer: Self-pay | Admitting: Family Medicine

## 2019-06-21 LAB — HIV ANTIBODY (ROUTINE TESTING W REFLEX): HIV Screen 4th Generation wRfx: NONREACTIVE

## 2019-06-21 LAB — RPR: RPR Ser Ql: NONREACTIVE

## 2019-06-21 NOTE — Telephone Encounter (Signed)
Spoke to patient about her appointment on 9/4 @ 8:30. Patient instructed to wear a face mask for the entire appointment and no visitors are allowed with her during the visit. Patient screened for covid symptoms and denied having any

## 2019-06-22 ENCOUNTER — Ambulatory Visit (INDEPENDENT_AMBULATORY_CARE_PROVIDER_SITE_OTHER): Payer: Self-pay | Admitting: Lactation Services

## 2019-06-22 ENCOUNTER — Other Ambulatory Visit: Payer: Self-pay

## 2019-06-22 DIAGNOSIS — O4690 Antepartum hemorrhage, unspecified, unspecified trimester: Secondary | ICD-10-CM

## 2019-06-22 LAB — BETA HCG QUANT (REF LAB): hCG Quant: 1567 m[IU]/mL

## 2019-06-22 NOTE — Progress Notes (Addendum)
Pt here for follow up Beta Hcg and vaginal bleeding. Mom was present via Face Time for appt.   Pt is still bleeding and is changing her pad once a day. The bleeding is bright red to dark red. Pt reports she is having pain and is taking 2 Extra StrengthTylenol every 6 hours. Pain is cramping and sharp with a level of . Pt had several pictures and notes that she is passing clots and clumps of tissue. Pt reports nausea and lack of appetite.   Pt had Beta Hcg drawn. Informed pt we will have the results back in about 2 hours and I will call her with the results and follow up care. Pt voiced understanding.   11:25 received Bets Hcg Levels. Level is 1567. Reported to Kerry Hough, PA who reports pt has experiences a SAB. Follow up with weekly Hcg's until at zero.   11: 42 called pt to inform her that she has had a SAB and that she will need to follow up with weekly Hcg's until at 0. Pt voiced understanding.   Note to front office to call pt to schedule weekly Hcg's.

## 2019-06-29 ENCOUNTER — Ambulatory Visit: Payer: Self-pay | Admitting: General Practice

## 2019-06-29 ENCOUNTER — Other Ambulatory Visit: Payer: Self-pay

## 2019-06-29 DIAGNOSIS — O039 Complete or unspecified spontaneous abortion without complication: Secondary | ICD-10-CM

## 2019-06-29 NOTE — Progress Notes (Signed)
Bhcg does not need to be stat today- routine follow up from recent SAB.  Koren Bound RN BSN 06/29/19

## 2019-06-30 LAB — BETA HCG QUANT (REF LAB): hCG Quant: 121 m[IU]/mL

## 2019-07-02 ENCOUNTER — Telehealth: Payer: Self-pay

## 2019-07-02 NOTE — Telephone Encounter (Addendum)
-----   Message from Luvenia Redden, PA-C sent at 07/02/2019  9:13 AM EDT ----- Please inform patient that hCG continues to fall appropriately, but is still not back to normal yet. She will need another hCG (not stat) in 1 week. She does not have MyChart.   Luvenia Redden, PA-C 07/02/2019 9:13 AM  Called and informed pt results and that we need to have to come in one week for non stat beta on 07/06/19.  Pt states that she will be able to come in on 07/06/19 @ 1000.  Front notified to place pt on schedule.

## 2019-07-03 ENCOUNTER — Other Ambulatory Visit: Payer: Self-pay | Admitting: *Deleted

## 2019-07-03 DIAGNOSIS — O039 Complete or unspecified spontaneous abortion without complication: Secondary | ICD-10-CM

## 2019-07-05 ENCOUNTER — Telehealth: Payer: Self-pay | Admitting: *Deleted

## 2019-07-05 NOTE — Telephone Encounter (Signed)
Angela Taylor left a voicemail she needs proof of pregnancy for medicaid even though she had recent loss. I called [name] and confirmed we can provide a test stating she had a + pregnancy test at hospital with EDD. I will prepare letter and She can pick up during office hours. She voice understanding.  Linda,RN

## 2019-07-06 ENCOUNTER — Other Ambulatory Visit: Payer: Self-pay

## 2019-07-09 ENCOUNTER — Encounter: Payer: Self-pay | Admitting: Family Medicine

## 2019-08-22 ENCOUNTER — Ambulatory Visit: Payer: 59 | Admitting: Allergy

## 2020-04-22 ENCOUNTER — Ambulatory Visit: Payer: 59 | Admitting: Obstetrics and Gynecology

## 2020-07-07 ENCOUNTER — Other Ambulatory Visit: Payer: Self-pay

## 2020-07-07 ENCOUNTER — Encounter: Payer: Self-pay | Admitting: Nurse Practitioner

## 2020-07-07 ENCOUNTER — Ambulatory Visit (INDEPENDENT_AMBULATORY_CARE_PROVIDER_SITE_OTHER): Payer: BC Managed Care – PPO | Admitting: Nurse Practitioner

## 2020-07-07 VITALS — BP 106/70 | HR 76 | Temp 98.3°F | Ht 61.0 in | Wt 120.2 lb

## 2020-07-07 DIAGNOSIS — Z0000 Encounter for general adult medical examination without abnormal findings: Secondary | ICD-10-CM | POA: Diagnosis not present

## 2020-07-07 DIAGNOSIS — Z8759 Personal history of other complications of pregnancy, childbirth and the puerperium: Secondary | ICD-10-CM

## 2020-07-07 DIAGNOSIS — Z3A15 15 weeks gestation of pregnancy: Secondary | ICD-10-CM | POA: Insufficient documentation

## 2020-07-07 DIAGNOSIS — Z136 Encounter for screening for cardiovascular disorders: Secondary | ICD-10-CM

## 2020-07-07 DIAGNOSIS — Z13220 Encounter for screening for lipoid disorders: Secondary | ICD-10-CM

## 2020-07-07 LAB — URINALYSIS
Bilirubin Urine: NEGATIVE
Hgb urine dipstick: NEGATIVE
Ketones, ur: NEGATIVE
Leukocytes,Ua: NEGATIVE
Nitrite: NEGATIVE
Specific Gravity, Urine: 1.02 (ref 1.000–1.030)
Total Protein, Urine: NEGATIVE
Urine Glucose: NEGATIVE
Urobilinogen, UA: 0.2 (ref 0.0–1.0)
pH: 6 (ref 5.0–8.0)

## 2020-07-07 LAB — COMPREHENSIVE METABOLIC PANEL
ALT: 12 U/L (ref 0–35)
AST: 14 U/L (ref 0–37)
Albumin: 3.8 g/dL (ref 3.5–5.2)
Alkaline Phosphatase: 43 U/L (ref 39–117)
BUN: 8 mg/dL (ref 6–23)
CO2: 24 mEq/L (ref 19–32)
Calcium: 9.1 mg/dL (ref 8.4–10.5)
Chloride: 108 mEq/L (ref 96–112)
Creatinine, Ser: 0.48 mg/dL (ref 0.40–1.20)
GFR: 154.77 mL/min (ref >60.00)
Glucose, Bld: 82 mg/dL (ref 70–99)
Potassium: 5 mEq/L (ref 3.5–5.1)
Sodium: 139 mEq/L (ref 135–145)
Total Bilirubin: 0.4 mg/dL (ref 0.2–1.2)
Total Protein: 6.4 g/dL (ref 6.0–8.3)

## 2020-07-07 LAB — TSH: TSH: 1.63 u[IU]/mL (ref 0.35–4.50)

## 2020-07-07 NOTE — Patient Instructions (Addendum)
Congratulations Go to lab for blood draw and urine collection. You will be contacted to schedule appt with OB  Commonly Asked Questions During Pregnancy  Cats: A parasite can be excreted in cat feces.  To avoid exposure you need to have another person empty the little box.  If you must empty the litter box you will need to wear gloves.  Wash your hands after handling your cat.  This parasite can also be found in raw or undercooked meat so this should also be avoided.  Colds, Sore Throats, Flu: Please check your medication sheet to see what you can take for symptoms.  If your symptoms are unrelieved by these medications please call the office.  Dental Work: Most any dental work Agricultural consultant recommends is permitted.  X-rays should only be taken during the first trimester if absolutely necessary.  Your abdomen should be shielded with a lead apron during all x-rays.  Please notify your provider prior to receiving any x-rays.  Novocaine is fine; gas is not recommended.  If your dentist requires a note from [place] prior to dental work please call the office and we will provide one for you.  Exercise: Exercise is an important part of staying healthy during your pregnancy.  You may continue most exercises you were accustomed to prior to pregnancy.  Later in your pregnancy you will most likely notice you have difficulty with activities requiring balance like riding a bicycle.  It is important that you listen to your body and avoid activities that put you at a higher risk of falling.  Adequate rest and staying well hydrated are a must!  If you have questions about the safety of specific activities ask your provider.    Exposure to Children with illness: Try to avoid obvious exposure; report any symptoms to [place] when noted,  If you have chicken pos, red measles or mumps, you should be immune to these diseases.   Please do not take any vaccines while pregnant unless you have checked with your OB provider.  Fetal  Movement: After 28 weeks we recommend you do "kick counts" twice daily.  Lie or sit down in a calm quiet environment and count your baby movements "kicks".  You should feel your baby at least 10 times per hour.  If you have not felt 10 kicks within the first hour get up, walk around and have something sweet to eat or drink then repeat for an additional hour.  If count remains less than 10 per hour notify your provider.  Fumigating: Follow your pest control agent's advice as to how long to stay out of your home.  Ventilate the area well before re-entering.  Hemorrhoids:   Most over-the-counter preparations can be used during pregnancy.  Check your medication to see what is safe to use.  It is important to use a stool softener or fiber in your diet and to drink lots of liquids.  If hemorrhoids seem to be getting worse please call the office.   Hot Tubs:  Hot tubs Jacuzzis and saunas are not recommended while pregnant.  These increase your internal body temperature and should be avoided.  Intercourse:  Sexual intercourse is safe during pregnancy as long as you are comfortable, unless otherwise advised by your provider.  Spotting may occur after intercourse; report any bright red bleeding that is heavier than spotting.  Labor:  If you know that you are in labor, please go to the hospital.  If you are unsure, please call the  office and let us help you decide what to do.  Lifting, straining, etc:  If your job requires heavy lifting or straining please check with your provider for any limitations.  Generally, you should not lift items heavier than that you can lift simply with your hands and arms (no back muscles)  Painting:  Paint fumes do not harm your pregnancy, but may make you ill and should be avoided if possible.  Latex or water based paints have less odor than oils.  Use adequate ventilation while painting.  Permanents & Hair Color:  Chemicals in hair dyes are not recommended as they cause increase  hair dryness which can increase hair loss during pregnancy.  " Highlighting" and permanents are allowed.  Dye may be absorbed differently and permanents may not hold as well during pregnancy.  Sunbathing:  Use a sunscreen, as skin burns easily during pregnancy.  Drink plenty of fluids; avoid over heating.  Tanning Beds:  Because their possible side effects are still unknown, tanning beds are not recommended.  Ultrasound Scans:  Routine ultrasounds are performed at approximately 20 weeks.  You will be able to see your baby's general anatomy an if you would like to know the gender this can usually be determined as well.  If it is questionable when you conceived you may also receive an ultrasound early in your pregnancy for dating purposes.  Otherwise ultrasound exams are not routinely performed unless there is a medical necessity.  Although you can request a scan we ask that you pay for it when conducted because insurance does not cover " patient request" scans.  Work: If your pregnancy proceeds without complications you may work until your due date, unless your physician or employer advises otherwise.  Round Ligament Pain/Pelvic Discomfort:  Sharp, shooting pains not associated with bleeding are fairly common, usually occurring in the second trimester of pregnancy.  They tend to be worse when standing up or when you remain standing for long periods of time.  These are the result of pressure of certain pelvic ligaments called "round ligaments".  Rest, Tylenol and heat seem to be the most effective relief.  As the womb and fetus grow, they rise out of the pelvis and the discomfort improves.  Please notify the office if your pain seems different than that described.  It may represent a more serious condition.

## 2020-07-07 NOTE — Progress Notes (Signed)
Subjective:  Patient ID: Angela Taylor, female    DOB: August 16, 1991  Age: 29 y.o. MRN: [id number]  CC: Establish Care (New patient/CPE, Pregnant (15 weeks)-in need of referral to OB)  HPI  [redacted] weeks gestation of pregnancy Married, at home with husband, works as Conservation officer, nature at [employer]. Hx of miscarriage at 5weeks, 1year ago She reports [place] completed 05/29/2020 indicated [redacted]weeks pregnant LMP 03/21/2020. No pelvic pain or cramps or vaginal bleeding/discharge or nausea Started daily prenatal with iron. Declined influenza and TDAP vaccine today Declined STD screen today Agreed to CBC, CMP, TSH, and urinalysis today. Entered ref to OB.   Depression screen PHQ 2/9 07/07/2020  Decreased Interest 0  Down, Depressed, Hopeless 0  PHQ - 2 Score 0   Reviewed past Medical, Social and Family history today.  Outpatient Medications Prior to Visit  Medication Sig Dispense Refill  . Prenatal Vit-Fe Fumarate-FA (PRENATAL MULTIVITAMIN) TABS tablet Take 1 tablet by mouth daily at 12 noon.     . diphenhydrAMINE (BENADRYL) 25 MG tablet Take 25 mg by mouth every 6 (six) hours as needed for itching or allergies. (Patient not taking: Reported on 07/07/2020)    . loratadine (CLARITIN) 10 MG tablet Take 10 mg by mouth daily. (Patient not taking: Reported on 07/07/2020)    . oxyCODONE-acetaminophen (PERCOCET/ROXICET) 5-325 MG tablet Take 1 tablet by mouth every 6 (six) hours as needed for severe pain. (Patient not taking: Reported on 06/22/2019) 12 tablet 0   No facility-administered medications prior to visit.   Past Medical History:  Diagnosis Date  . Angio-edema   . STD (sexually transmitted disease) 2014   Herpes- has not had outbreak  . Urticaria    Family History  Problem Relation Age of Onset  . Diabetes Mother   . Kidney Stones Mother   . Asthma Mother   . Allergic rhinitis Mother   . Diabetes Brother   . Allergic rhinitis Brother   . Asthma Brother   . Bipolar disorder Maternal Aunt   .  Dementia Maternal Grandmother   . Bipolar disorder Brother   . Allergic rhinitis Brother   . Asthma Brother   . Schizophrenia Cousin    Social History   Socioeconomic History  . Marital status: Married    Spouse name: Not on file  . Number of children: 0  . Years of education: Not on file  . Highest education level: Not on file  Occupational History  . Not on file  Tobacco Use  . Smoking status: Former Smoker    Types: Cigarettes    Quit date: 10/18/2017    Years since quitting: 2.7  . Smokeless tobacco: Never Used  Vaping Use  . Vaping Use: Former  Substance and Sexual Activity  . Alcohol use: Yes    Comment: occasional   . Drug use: No  . Sexual activity: Yes    Comment: pregnant  Other Topics Concern  . Not on file  Social History Narrative  . Not on file   Social Determinants of Health   Financial Resource Strain:   . Difficulty of Paying Living Expenses: Not on file  Food Insecurity:   . Worried About Programme researcher, broadcasting/film/video in the Last Year: Not on file  . Ran Out of Food in the Last Year: Not on file  Transportation Needs:   . Lack of Transportation (Medical): Not on file  . Lack of Transportation (Non-Medical): Not on file  Physical Activity:   . Days of Exercise per Week:  Not on file  . Minutes of Exercise per Session: Not on file  Stress:   . Feeling of Stress : Not on file  Social Connections:   . Frequency of Communication with Friends and Family: Not on file  . Frequency of Social Gatherings with Friends and Family: Not on file  . Attends Religious Services: Not on file  . Active Member of Clubs or Organizations: Not on file  . Attends Banker Meetings: Not on file  . Marital Status: Not on file  Intimate Partner Violence:   . Fear of Current or Ex-Partner: Not on file  . Emotionally Abused: Not on file  . Physically Abused: Not on file  . Sexually Abused: Not on file   ROS Review of Systems  Constitutional: Negative for fever,  malaise/fatigue and weight loss.  HENT: Negative for congestion and sore throat.   Eyes:       Negative for visual changes  Respiratory: Negative for cough and shortness of breath.   Cardiovascular: Negative for chest pain, palpitations and leg swelling.  Gastrointestinal: Negative for blood in stool, constipation, diarrhea and heartburn.  Genitourinary: Negative for dysuria, frequency and urgency.  Musculoskeletal: Negative for falls, joint pain and myalgias.  Skin: Negative for rash.  Neurological: Negative for dizziness, sensory change and headaches.  Endo/Heme/Allergies: Does not bruise/bleed easily.  Psychiatric/Behavioral: Negative for depression, substance abuse and suicidal ideas. The patient is not nervous/anxious.     Objective:  BP 106/70 (BP Location: Left Arm, Patient Position: Sitting, Cuff Size: Normal)   Pulse 76   Temp 98.3 F (36.8 C) (Temporal)   Ht 5\' 1"  (1.549 m)   Wt 120 lb 3.2 oz (54.5 kg)   SpO2 100%   BMI 22.71 kg/m   Physical Exam Vitals reviewed.  HENT:     Right Ear: Tympanic membrane, ear canal and external ear normal.     Left Ear: Tympanic membrane, ear canal and external ear normal.  Eyes:     Extraocular Movements: Extraocular movements intact.     Conjunctiva/sclera: Conjunctivae normal.  Cardiovascular:     Rate and Rhythm: Normal rate and regular rhythm.     Pulses: Normal pulses.     Heart sounds: Normal heart sounds.  Pulmonary:     Effort: Pulmonary effort is normal.     Breath sounds: Normal breath sounds.  Genitourinary:    Comments: Deferred breast and pelvic exam to OB per patient Musculoskeletal:     Cervical back: Normal range of motion and neck supple.  Lymphadenopathy:     Cervical: No cervical adenopathy.  Neurological:     Mental Status: She is alert and oriented to person, place, and time.  Psychiatric:        Mood and Affect: Mood normal.        Behavior: Behavior normal.        Thought Content: Thought content  normal.     Assessment & Plan:  This visit occurred during the SARS-CoV-2 public health emergency.  Safety protocols were in place, including screening questions prior to the visit, additional usage of staff PPE, and extensive cleaning of exam room while observing appropriate contact time as indicated for disinfecting solutions.   Jozy was seen today for establish care.  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 Hx of miscarriage at 5weeks, 1year ago She reports [place] completed 05/29/2020 indicated [redacted]weeks pregnant LMP 03/21/2020. No pelvic pain or cramps or vaginal bleeding/discharge or nausea Started daily prenatal with iron. Declined influenza and TDAP vaccine today Declined STD screen today Agreed to CBC, CMP, TSH, and urinalysis today. Entered ref to OB.       Relevant Orders   Ambulatory referral to Obstetrics / Gynecology   Urinalysis    Other Visit Diagnoses    Preventative health care    -  Primary   Relevant Orders   CBC with Differential/Platelet   Comprehensive metabolic panel   TSH   Encounter for lipid screening for cardiovascular disease       Hx of one miscarriage       Relevant Orders   Ambulatory referral to Obstetrics / Gynecology      Follow-up: Return if symptoms worsen or fail to improve.  Alysia Penna, NP

## 2020-07-07 NOTE — Assessment & Plan Note (Addendum)
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 Hx of miscarriage at 5weeks, 1year ago She reports [place] completed 05/29/2020 indicated [redacted]weeks pregnant LMP 03/21/2020. No pelvic pain or cramps or vaginal bleeding/discharge or nausea Started daily prenatal with iron. Declined influenza and TDAP vaccine today Declined STD screen today Agreed to CBC, CMP, TSH, and urinalysis today. Entered ref to OB.

## 2020-07-08 LAB — CBC WITH DIFFERENTIAL/PLATELET
Basophils Absolute: 0 10*3/uL (ref 0.0–0.1)
Basophils Relative: 0.3 % (ref 0.0–3.0)
Eosinophils Absolute: 0.2 10*3/uL (ref 0.0–0.7)
Eosinophils Relative: 2.9 % (ref 0.0–5.0)
HCT: 36.2 % (ref 36.0–46.0)
Hemoglobin: 12.3 g/dL (ref 12.0–15.0)
Lymphocytes Relative: 21.8 % (ref 12.0–46.0)
Lymphs Abs: 1.8 10*3/uL (ref 0.7–4.0)
MCHC: 34 g/dL (ref 30.0–36.0)
MCV: 93.9 fl (ref 78.0–100.0)
Monocytes Absolute: 0.5 10*3/uL (ref 0.1–1.0)
Monocytes Relative: 6 % (ref 3.0–12.0)
Neutro Abs: 5.6 10*3/uL (ref 1.4–7.7)
Neutrophils Relative %: 69 % (ref 43.0–77.0)
Platelets: 266 10*3/uL (ref 150.0–400.0)
RBC: 3.86 Mil/uL — ABNORMAL LOW (ref 3.87–5.11)
RDW: 12.7 % (ref 11.5–15.5)
WBC: 8.1 10*3/uL (ref 4.0–10.5)

## 2020-07-23 ENCOUNTER — Other Ambulatory Visit (HOSPITAL_COMMUNITY)
Admission: RE | Admit: 2020-07-23 | Discharge: 2020-07-23 | Disposition: A | Discharge status: Home / Self Care | Payer: BC Managed Care – PPO | Source: Ambulatory Visit | Attending: Obstetrics | Admitting: Obstetrics

## 2020-07-23 ENCOUNTER — Encounter: Payer: Self-pay | Admitting: Obstetrics

## 2020-07-23 ENCOUNTER — Ambulatory Visit (INDEPENDENT_AMBULATORY_CARE_PROVIDER_SITE_OTHER): Payer: BC Managed Care – PPO | Admitting: Obstetrics

## 2020-07-23 ENCOUNTER — Other Ambulatory Visit: Payer: Self-pay

## 2020-07-23 VITALS — BP 119/83 | HR 84 | Wt 121.7 lb

## 2020-07-23 DIAGNOSIS — Z3480 Encounter for supervision of other normal pregnancy, unspecified trimester: Secondary | ICD-10-CM | POA: Insufficient documentation

## 2020-07-23 DIAGNOSIS — O0993 Supervision of high risk pregnancy, unspecified, third trimester: Secondary | ICD-10-CM | POA: Diagnosis not present

## 2020-07-23 DIAGNOSIS — Z3A35 35 weeks gestation of pregnancy: Secondary | ICD-10-CM | POA: Diagnosis not present

## 2020-07-23 DIAGNOSIS — Z3482 Encounter for supervision of other normal pregnancy, second trimester: Secondary | ICD-10-CM | POA: Diagnosis not present

## 2020-07-23 DIAGNOSIS — O365930 Maternal care for other known or suspected poor fetal growth, third trimester, not applicable or unspecified: Secondary | ICD-10-CM | POA: Diagnosis not present

## 2020-07-23 DIAGNOSIS — O133 Gestational [pregnancy-induced] hypertension without significant proteinuria, third trimester: Secondary | ICD-10-CM | POA: Diagnosis not present

## 2020-07-23 DIAGNOSIS — O0990 Supervision of high risk pregnancy, unspecified, unspecified trimester: Secondary | ICD-10-CM | POA: Insufficient documentation

## 2020-07-23 NOTE — Progress Notes (Signed)
Subjective:    Angela Taylor is being seen today for her first obstetrical visit.  This is a planned pregnancy. She is at [redacted]w[redacted]d gestation. Her obstetrical history is significant for none. Relationship with FOB: spouse, living together. Patient does intend to breast feed. Pregnancy history fully reviewed.  The information documented in the HPI was reviewed and verified.  Menstrual History: OB History    Gravida  2   Para  0   Term  0   Preterm  0   AB  1   Living  0     SAB  1   TAB  0   Ectopic  0   Multiple  0   Live Births  0           Patient's last menstrual period was 03/19/2020.    Past Medical History:  Diagnosis Date  . Angio-edema   . STD (sexually transmitted disease) 2014   Herpes- has not had outbreak  . Urticaria     Past Surgical History:  Procedure Laterality Date  . WISDOM TOOTH EXTRACTION      (Not in a hospital admission)  Allergies  Allergen Reactions  . Penicillins Hives    Did it involve swelling of the face/tongue/throat, SOB, or low BP? No Did it involve sudden or severe rash/hives, skin peeling, or any reaction on the inside of your mouth or nose? No Did you need to seek medical attention at a hospital or doctor's office? No When did it last happen? If all above answers are "NO", may proceed with cephalosporin use.   Marland Kitchen Pistachio Nut (Diagnostic) Itching  . Shrimp [Shellfish Allergy] Itching    Social History   Tobacco Use  . Smoking status: Former Smoker    Types: Cigarettes    Quit date: 10/18/2017    Years since quitting: 2.7  . Smokeless tobacco: Never Used  Substance Use Topics  . Alcohol use: Not Currently    Comment: occasional     Family History  Problem Relation Age of Onset  . Diabetes Mother   . Kidney Stones Mother   . Asthma Mother   . Allergic rhinitis Mother   . Diabetes Brother   . Allergic rhinitis Brother   . Asthma Brother   . Bipolar disorder Maternal Aunt   . Dementia Maternal  Grandmother   . Bipolar disorder Brother   . Allergic rhinitis Brother   . Asthma Brother   . Schizophrenia Cousin      Review of Systems Constitutional: negative for weight loss Gastrointestinal: negative for vomiting Genitourinary:negative for genital lesions and vaginal discharge and dysuria Musculoskeletal:negative for back pain Behavioral/Psych: negative for abusive relationship, depression, illegal drug usage and tobacco use    Objective:    BP 119/83   Pulse 84   Wt 121 lb 11.2 oz (55.2 kg)   LMP 03/19/2020   BMI 23.00 kg/m  General Appearance:    Alert, cooperative, no distress, appears stated age  Head:    Normocephalic, without obvious abnormality, atraumatic  Eyes:    PERRL, conjunctiva/corneas clear, EOM's intact, fundi    benign, both eyes  Ears:    Normal TM's and external ear canals, both ears  Nose:   Nares normal, septum midline, mucosa normal, no drainage    or sinus tenderness  Throat:   Lips, mucosa, and tongue normal; teeth and gums normal  Neck:   Supple, symmetrical, trachea midline, no adenopathy;    thyroid:  no enlargement/tenderness/nodules; no carotid  bruit or JVD  Back:     Symmetric, no curvature, ROM normal, no CVA tenderness  Lungs:     Clear to auscultation bilaterally, respirations unlabored  Chest Wall:    No tenderness or deformity   Heart:    Regular rate and rhythm, S1 and S2 normal, no murmur, rub   or gallop  Breast Exam:    No tenderness, masses, or nipple abnormality  Abdomen:     Soft, non-tender, bowel sounds active all four quadrants,    no masses, no organomegaly  Genitalia:    Normal female without lesion, discharge or tenderness  Extremities:   Extremities normal, atraumatic, no cyanosis or edema  Pulses:   2+ and symmetric all extremities  Skin:   Skin color, texture, turgor normal, no rashes or lesions  Lymph nodes:   Cervical, supraclavicular, and axillary nodes normal  Neurologic:   CNII-XII intact, normal strength,  sensation and reflexes    throughout      Lab Review Urine pregnancy test Labs reviewed yes Radiologic studies reviewed no Assessment:    Pregnancy at [redacted]w[redacted]d weeks    Plan:     1. Supervision of other normal pregnancy, antepartum Rx: - CBC/D/Plt+RPR+Rh+ABO+Rub Ab... - Cervicovaginal ancillary only - Culture, OB Urine - Genetic Screening - Enroll Patient in Babyscripts - AFP, Serum, Open Spina Bifida - [place] MFM OB COMP + 14 WK; Future   Prenatal vitamins.  Counseling provided regarding continued use of seat belts, cessation of alcohol consumption, smoking or use of illicit drugs; infection precautions i.e., influenza/TDAP immunizations, toxoplasmosis,CMV, parvovirus, listeria and varicella; workplace safety, exercise during pregnancy; routine dental care, safe medications, sexual activity, hot tubs, saunas, pools, travel, caffeine use, fish and methlymercury, potential toxins, hair treatments, varicose veins Weight gain recommendations per IOM guidelines reviewed: underweight/BMI< 18.5--> gain 28 - 40 lbs; normal weight/BMI 18.5 - 24.9--> gain 25 - 35 lbs; overweight/BMI 25 - 29.9--> gain 15 - 25 lbs; obese/BMI >30->gain  11 - 20 lbs Problem list reviewed and updated. FIRST/CF mutation testing/NIPT/QUAD SCREEN/fragile X/Ashkenazi Jewish population testing/Spinal muscular atrophy discussed: requested. Role of ultrasound in pregnancy discussed; fetal survey: requested. Amniocentesis discussed: not indicated.   Orders Placed This Encounter  Procedures  . Culture, OB Urine  . [place] MFM OB COMP + 14 WK    Standing Status:   Future    Standing Expiration Date:   07/23/2021    Order Specific Question:   Reason for Exam (SYMPTOM  OR DIAGNOSIS REQUIRED)    Answer:   Anatomy    Order Specific Question:   Preferred Location    Answer:   [facility] Ultrasound  . CBC/D/Plt+RPR+Rh+ABO+Rub Ab...  . Genetic Screening    [facility]  . AFP, Serum, Open Spina Bifida    Order Specific Question:   Is  patient insulin dependent?    Answer:   No    Order Specific Question:   Patient weight (lb.)    Answer:   121 lb 11.2 oz (55.2 kg)    Order Specific Question:   Gestational Age (GA), weeks    Answer:   17    Order Specific Question:   Date on which patient was at this GA    Answer:   07/23/2020    Order Specific Question:   GA Calculation Method    Answer:   LMP    Order Specific Question:   Number of fetuses    Answer:   1    Order Specific Question:   Reason  for screen    Answer:   OTHER    Comments:   Desires AFP screening    Order Specific Question:   Additional information?    Answer:   N/A    Order Specific Question:   Donor egg?    Answer:   N    Follow up in 4 weeks. 50% of 20 min visit spent on counseling and coordination of care.    Brock Bad, MD 07/23/2020 1:44 PM

## 2020-07-23 NOTE — Progress Notes (Signed)
Pt is here for initial OB visit.   LMP 03/19/2020  EDD 12/24/2020

## 2020-07-24 LAB — CERVICOVAGINAL ANCILLARY ONLY
Bacterial Vaginitis (gardnerella): NEGATIVE
Candida Glabrata: NEGATIVE
Candida Vaginitis: NEGATIVE
Chlamydia: NEGATIVE
Comment: NEGATIVE
Comment: NEGATIVE
Comment: NEGATIVE
Comment: NEGATIVE
Comment: NEGATIVE
Comment: NORMAL
Neisseria Gonorrhea: NEGATIVE
Trichomonas: NEGATIVE

## 2020-07-25 LAB — AFP, SERUM, OPEN SPINA BIFIDA
AFP MoM: 1.24
AFP Value: 63 ng/mL
Gest. Age on Collection Date: 18 weeks
Maternal Age At EDD: 27.7 yr
OSBR Risk 1 IN: 5748
Test Results:: NEGATIVE
Weight: 121 [lb_av]

## 2020-07-25 LAB — CBC/D/PLT+RPR+RH+ABO+RUB AB...
Antibody Screen: NEGATIVE
Basophils Absolute: 0 10*3/uL (ref 0.0–0.2)
Basos: 0 %
EOS (ABSOLUTE): 0.2 10*3/uL (ref 0.0–0.4)
Eos: 2 %
HCV Ab: 0.1 s/co ratio (ref 0.0–0.9)
HIV Screen 4th Generation wRfx: NONREACTIVE
Hematocrit: 35.2 % (ref 34.0–46.6)
Hemoglobin: 12.3 g/dL (ref 11.1–15.9)
Hepatitis B Surface Ag: NEGATIVE
Immature Grans (Abs): 0 10*3/uL (ref 0.0–0.1)
Immature Granulocytes: 0 %
Lymphocytes Absolute: 1.5 10*3/uL (ref 0.7–3.1)
Lymphs: 19 %
MCH: 32.5 pg (ref 26.6–33.0)
MCHC: 34.9 g/dL (ref 31.5–35.7)
MCV: 93 fL (ref 79–97)
Monocytes Absolute: 0.4 10*3/uL (ref 0.1–0.9)
Monocytes: 5 %
Neutrophils Absolute: 5.6 10*3/uL (ref 1.4–7.0)
Neutrophils: 74 %
Platelets: 287 10*3/uL (ref 150–450)
RBC: 3.78 x10E6/uL (ref 3.77–5.28)
RDW: 12.4 % (ref 11.7–15.4)
RPR Ser Ql: NONREACTIVE
Rh Factor: POSITIVE
Rubella Antibodies, IGG: 1.78 index (ref >0.99)
WBC: 7.6 10*3/uL (ref 3.4–10.8)

## 2020-07-25 LAB — CULTURE, OB URINE

## 2020-07-25 LAB — URINE CULTURE, OB REFLEX

## 2020-07-25 LAB — HCV INTERPRETATION

## 2020-07-31 ENCOUNTER — Telehealth: Payer: Self-pay | Admitting: Nurse Practitioner

## 2020-07-31 NOTE — Telephone Encounter (Signed)
Please advise 

## 2020-07-31 NOTE — Telephone Encounter (Signed)
Patient is calling and stated that she has been experiencing eczema breakouts and wanted to know what she can use otc, please advise. CB is [phone]

## 2020-08-01 ENCOUNTER — Telehealth: Payer: Self-pay

## 2020-08-01 NOTE — Telephone Encounter (Signed)
Patient called in to request an appointment today- She reports having a rash with some itching that started a couple of days ago and is requesting an appointment. I advised her we do not have any appointments at today. I ask patient if she has applied anything to the rash and patient has not. I advised her to take a picture of the rash and upload it to my-chart, so we can get a better view of the rash. She can apply hydrocortisone cream to the area and take benadryl if needed. I have advised her the benadryl may make her a little tired. Patient voice understanding at this time.

## 2020-08-01 NOTE — Telephone Encounter (Signed)
Ok to use cortisone cream OTC as directed on package

## 2020-08-01 NOTE — Telephone Encounter (Signed)
lft VM to advise that she may use OTC Cortizone cream.  Advised if any further questions to please call us back.  Dm/cma

## 2020-08-07 ENCOUNTER — Other Ambulatory Visit: Payer: Self-pay | Admitting: *Deleted

## 2020-08-07 ENCOUNTER — Other Ambulatory Visit: Payer: Self-pay

## 2020-08-07 ENCOUNTER — Ambulatory Visit: Payer: BC Managed Care – PPO | Attending: Obstetrics

## 2020-08-07 DIAGNOSIS — Z3480 Encounter for supervision of other normal pregnancy, unspecified trimester: Secondary | ICD-10-CM | POA: Diagnosis not present

## 2020-08-07 DIAGNOSIS — Z362 Encounter for other antenatal screening follow-up: Secondary | ICD-10-CM

## 2020-08-20 ENCOUNTER — Telehealth: Payer: Self-pay

## 2020-08-20 ENCOUNTER — Telehealth (INDEPENDENT_AMBULATORY_CARE_PROVIDER_SITE_OTHER): Payer: BC Managed Care – PPO | Admitting: Advanced Practice Midwife

## 2020-08-20 DIAGNOSIS — Z3480 Encounter for supervision of other normal pregnancy, unspecified trimester: Secondary | ICD-10-CM

## 2020-08-20 NOTE — Progress Notes (Signed)
Pt did not answer when called for virtual visit.

## 2020-08-21 ENCOUNTER — Encounter: Payer: Self-pay | Admitting: Obstetrics and Gynecology

## 2020-08-21 ENCOUNTER — Telehealth (INDEPENDENT_AMBULATORY_CARE_PROVIDER_SITE_OTHER): Payer: BC Managed Care – PPO | Admitting: Obstetrics and Gynecology

## 2020-08-21 DIAGNOSIS — Z3480 Encounter for supervision of other normal pregnancy, unspecified trimester: Secondary | ICD-10-CM

## 2020-08-21 DIAGNOSIS — O365990 Maternal care for other known or suspected poor fetal growth, unspecified trimester, not applicable or unspecified: Secondary | ICD-10-CM | POA: Insufficient documentation

## 2020-08-21 DIAGNOSIS — Z3A22 22 weeks gestation of pregnancy: Secondary | ICD-10-CM

## 2020-08-21 DIAGNOSIS — O365920 Maternal care for other known or suspected poor fetal growth, second trimester, not applicable or unspecified: Secondary | ICD-10-CM

## 2020-08-21 NOTE — Progress Notes (Signed)
   TELEHEALTH OBSTETRICS VISIT ENCOUNTER NOTE  I connected with [name] on 08/21/20 at  4:00 PM EDT by telephone at home and verified that I am speaking with the correct person using two identifiers.   I discussed the limitations, risks, security and privacy concerns of performing an evaluation and management service by telephone and the availability of in person appointments. I also discussed with the patient that there may be a patient responsible charge related to this service. The patient expressed understanding and agreed to proceed.  Patient is at home, provider is in the office.   Subjective:  Angela Taylor is a 29 y.o. G2P0010 at [redacted]w[redacted]d being followed for ongoing prenatal care.  She is currently monitored for the following issues for this low-risk pregnancy and has [redacted] weeks gestation of pregnancy and Supervision of other normal pregnancy, antepartum on their problem list.  Patient reports no complaints. Reports fetal movement. Denies any contractions, bleeding or leaking of fluid.   The following portions of the patient's history were reviewed and updated as appropriate: allergies, current medications, past family history, past medical history, past social history, past surgical history and problem list.   Objective:   General:  Alert, oriented and cooperative.   Mental Status: Normal mood and affect perceived. Normal judgment and thought content.  Rest of physical exam deferred due to type of encounter  Assessment and Plan:   1. Supervision of other normal pregnancy, antepartum  ?IUGR- MFM recommends dopplers and growth [place] however patient canceled all appointments with MFM d/t cost. The patient is planning to call [facility] to discuss options. She was told MFM was out of network for her.  She should have an in-person visit in 4 weeks with [place].  Discussed the importance of MFM [place] and keeping appointments for the health of the baby. Patient and I in contact through  mychart messages to find the best place for her to get [place].   Weight 128.8 today LBS  + fetal movement Unable to find BP cuff today.  2. [redacted] weeks gestation of pregnancy  In-person visit in 4 weeks for fundal check and 2 hour GTT  Preterm labor symptoms and general obstetric precautions including but not limited to vaginal bleeding, contractions, leaking of fluid and fetal movement were reviewed in detail with the patient. Please refer to After Visit Summary for other counseling recommendations.   I provided 12  minutes of non-face-to-face time during this encounter.  Return in about 4 weeks (around 09/18/2020), or 4-5 weeks for gtt.  No future appointments.  Venia Carbon, NP [facility] [employer], [facility]

## 2020-08-21 NOTE — Progress Notes (Signed)
Virtual Visit via Telephone Note  I connected with Angela Taylor on 08/21/20 at  4:00 PM EDT by telephone and verified that I am speaking with the correct person using two identifiers.  Pt can not find BP cuff Pt reports pruritic rash on both elbows - has relief with eczema lotion per pt

## 2020-08-22 ENCOUNTER — Ambulatory Visit: Payer: BC Managed Care – PPO

## 2020-09-04 ENCOUNTER — Ambulatory Visit: Payer: BC Managed Care – PPO

## 2020-09-05 NOTE — Telephone Encounter (Signed)
Patient called in to cancel her appointments on 11/5 and 11/18. Appointments have been cancelled.   Notified our pPatient called in to cancel her appointments on 11/5 and 11/18. Appointments have been cancelled.   Notified our provider via inbasket message.  rovider via [employer].

## 2020-09-08 ENCOUNTER — Other Ambulatory Visit: Payer: Self-pay | Admitting: *Deleted

## 2020-09-08 ENCOUNTER — Ambulatory Visit: Payer: BC Managed Care – PPO | Attending: Maternal & Fetal Medicine | Admitting: *Deleted

## 2020-09-08 ENCOUNTER — Other Ambulatory Visit: Payer: Self-pay

## 2020-09-08 ENCOUNTER — Ambulatory Visit: Payer: BC Managed Care – PPO

## 2020-09-08 ENCOUNTER — Encounter: Payer: Self-pay | Admitting: *Deleted

## 2020-09-08 ENCOUNTER — Ambulatory Visit (HOSPITAL_BASED_OUTPATIENT_CLINIC_OR_DEPARTMENT_OTHER): Payer: BC Managed Care – PPO

## 2020-09-08 DIAGNOSIS — B009 Herpesviral infection, unspecified: Secondary | ICD-10-CM

## 2020-09-08 DIAGNOSIS — O365920 Maternal care for other known or suspected poor fetal growth, second trimester, not applicable or unspecified: Secondary | ICD-10-CM

## 2020-09-08 DIAGNOSIS — O98512 Other viral diseases complicating pregnancy, second trimester: Secondary | ICD-10-CM

## 2020-09-08 DIAGNOSIS — Z362 Encounter for other antenatal screening follow-up: Secondary | ICD-10-CM | POA: Diagnosis not present

## 2020-09-08 DIAGNOSIS — Z3480 Encounter for supervision of other normal pregnancy, unspecified trimester: Secondary | ICD-10-CM

## 2020-09-08 DIAGNOSIS — Z3A24 24 weeks gestation of pregnancy: Secondary | ICD-10-CM

## 2020-09-09 LAB — CMV IGM: CMV IgM Ser EIA-aCnc: <30 AU/mL (ref 0.0–29.9)

## 2020-09-09 LAB — CMV ANTIBODY, IGG (EIA): CMV Ab - IgG: <0.6 U/mL (ref 0.00–0.59)

## 2020-09-15 ENCOUNTER — Telehealth: Payer: Self-pay | Admitting: Genetic Counselor

## 2020-09-15 NOTE — Telephone Encounter (Signed)
Received a call back from Ms. Kasa to discuss her test results. Ms. Clasby had a sample drawn for noninvasive prenatal screening (NIPS) through the laboratory [facility] last week. Testing was offered because of fetal growth restriction noted on ultrasound. These negative results demonstrated an expected representation of chromosome 21, 18, and 13 material, greatly reducing the likelihood of trisomies 21, 13, or 18 for the pregnancy. NIPS analyzes placental (fetal) DNA in maternal circulation. NIPS is considered to be highly specific and sensitive, but is not considered to be a diagnostic test. We reviewed that this testing identifies 91-99% of pregnancies with trisomies 34, 107, and 18 but does not test for all genetic conditions. Ms. Stegner was counseled that it is unlikely that trisomy 84, trisomy 55, or trisomy 44 accounts for fetal growth restriction in light of these results.  Ms. Seguin also had a sample drawn for cytomegalovirus (CMV) testing last week. Both CMV IgG and IgM measurements were normal. Ms. Pizana was counseled that fetal growth restriction is likely not due to an intrauterine CMV infection either. Ms. Sheffer confirmed that she had no questions about either of these results at this time.  Gershon Crane, MS, [facility] Genetic Counselor

## 2020-09-15 NOTE — Telephone Encounter (Signed)
LVM for Ms. Mcgaughey re: good news about screening results. Requested a call back to my direct line to discuss these in more detail.   Gershon Crane, MS, [facility] Genetic Counselor

## 2020-09-18 ENCOUNTER — Ambulatory Visit (INDEPENDENT_AMBULATORY_CARE_PROVIDER_SITE_OTHER): Payer: BC Managed Care – PPO | Admitting: Obstetrics and Gynecology

## 2020-09-18 ENCOUNTER — Other Ambulatory Visit: Payer: Self-pay

## 2020-09-18 ENCOUNTER — Other Ambulatory Visit: Payer: BC Managed Care – PPO

## 2020-09-18 DIAGNOSIS — Z3480 Encounter for supervision of other normal pregnancy, unspecified trimester: Secondary | ICD-10-CM | POA: Diagnosis not present

## 2020-09-18 DIAGNOSIS — O365990 Maternal care for other known or suspected poor fetal growth, unspecified trimester, not applicable or unspecified: Secondary | ICD-10-CM

## 2020-09-18 NOTE — Progress Notes (Signed)
   PRENATAL VISIT NOTE  Subjective:  Angela Taylor is a 29 y.o. G2P0010 at [redacted]w[redacted]d being seen today for ongoing prenatal care.  She is currently monitored for the following issues for this low-risk pregnancy and has [redacted] weeks gestation of pregnancy; Supervision of other normal pregnancy, antepartum; and IUGR (intrauterine growth restriction) affecting care of mother on their problem list.  Patient reports no complaints.  Contractions: Not present. Vag. Bleeding: None.  Movement: Present. Denies leaking of fluid.   The following portions of the patient's history were reviewed and updated as appropriate: allergies, current medications, past family history, past medical history, past social history, past surgical history and problem list.   Objective:   Vitals:   09/18/20 0906  BP: 121/80  Pulse: 74  Weight: 132 lb (59.9 kg)    Fetal Status: Fetal Heart Rate (bpm): 140 Fundal Height: 27 cm Movement: Present     General:  Alert, oriented and cooperative. Patient is in no acute distress.  Skin: Skin is warm and dry. No rash noted.   Cardiovascular: Normal heart rate noted  Respiratory: Normal respiratory effort, no problems with respiration noted  Abdomen: Soft, gravid, appropriate for gestational age.  Pain/Pressure: Absent     Pelvic: Cervical exam deferred        Extremities: Normal range of motion.     Mental Status: Normal mood and affect. Normal behavior. Normal judgment and thought content.   Assessment and Plan:  Pregnancy: G2P0010 at [redacted]w[redacted]d 1. Poor fetal growth affecting management of mother, antepartum, single or unspecified fetus  Continue MFM [place]  2. Supervision of other normal pregnancy, antepartum  - Glucose Tolerance, 2 Hours w/1 Hour - CBC - HIV Antibody (routine testing w rflx) - RPR  Preterm labor symptoms and general obstetric precautions including but not limited to vaginal bleeding, contractions, leaking of fluid and fetal movement were reviewed in detail with  the patient. Please refer to After Visit Summary for other counseling recommendations.   No follow-ups on file.  Future Appointments  Date Time Provider Department Center  09/18/2020 10:35 AM Marilu Rylander, Harolyn Rutherford, NP [facility] None  09/22/2020  7:30 AM [facility] NURSE [facility]  09/22/2020  7:45 AM [facility] US5 [facility]  09/29/2020  7:30 AM [facility] NURSE [facility]  09/29/2020  7:45 AM [facility] US4 [facility]    Venia Carbon, NP

## 2020-09-18 NOTE — Progress Notes (Signed)
Pt is doing well.  Scheduled for u/s on 12/6.

## 2020-09-19 LAB — CBC
Hematocrit: 33.3 % — ABNORMAL LOW (ref 34.0–46.6)
Hemoglobin: 11.3 g/dL (ref 11.1–15.9)
MCH: 32.4 pg (ref 26.6–33.0)
MCHC: 33.9 g/dL (ref 31.5–35.7)
MCV: 95 fL (ref 79–97)
Platelets: 280 10*3/uL (ref 150–450)
RBC: 3.49 x10E6/uL — ABNORMAL LOW (ref 3.77–5.28)
RDW: 12.4 % (ref 11.7–15.4)
WBC: 7.8 10*3/uL (ref 3.4–10.8)

## 2020-09-19 LAB — GLUCOSE TOLERANCE, 2 HOURS W/ 1HR
Glucose, 1 hour: 117 mg/dL (ref 65–179)
Glucose, 2 hour: 104 mg/dL (ref 65–152)
Glucose, Fasting: 78 mg/dL (ref 65–91)

## 2020-09-19 LAB — HIV ANTIBODY (ROUTINE TESTING W REFLEX): HIV Screen 4th Generation wRfx: NONREACTIVE

## 2020-09-19 LAB — RPR: RPR Ser Ql: NONREACTIVE

## 2020-09-22 ENCOUNTER — Other Ambulatory Visit: Payer: Self-pay

## 2020-09-22 ENCOUNTER — Ambulatory Visit: Payer: BC Managed Care – PPO | Attending: Obstetrics and Gynecology

## 2020-09-22 ENCOUNTER — Encounter: Payer: Self-pay | Admitting: *Deleted

## 2020-09-22 ENCOUNTER — Ambulatory Visit: Payer: BC Managed Care – PPO | Admitting: *Deleted

## 2020-09-22 ENCOUNTER — Other Ambulatory Visit: Payer: Self-pay | Admitting: *Deleted

## 2020-09-22 DIAGNOSIS — Z3480 Encounter for supervision of other normal pregnancy, unspecified trimester: Secondary | ICD-10-CM | POA: Diagnosis not present

## 2020-09-22 DIAGNOSIS — O321XX0 Maternal care for breech presentation, not applicable or unspecified: Secondary | ICD-10-CM

## 2020-09-22 DIAGNOSIS — O365920 Maternal care for other known or suspected poor fetal growth, second trimester, not applicable or unspecified: Secondary | ICD-10-CM | POA: Diagnosis not present

## 2020-09-22 DIAGNOSIS — O98512 Other viral diseases complicating pregnancy, second trimester: Secondary | ICD-10-CM

## 2020-09-22 DIAGNOSIS — B009 Herpesviral infection, unspecified: Secondary | ICD-10-CM

## 2020-09-22 DIAGNOSIS — Z3A26 26 weeks gestation of pregnancy: Secondary | ICD-10-CM

## 2020-09-29 ENCOUNTER — Encounter: Payer: Self-pay | Admitting: *Deleted

## 2020-09-29 ENCOUNTER — Ambulatory Visit: Payer: BC Managed Care – PPO

## 2020-09-29 ENCOUNTER — Other Ambulatory Visit: Payer: Self-pay

## 2020-09-29 ENCOUNTER — Ambulatory Visit (HOSPITAL_BASED_OUTPATIENT_CLINIC_OR_DEPARTMENT_OTHER): Payer: BC Managed Care – PPO

## 2020-09-29 ENCOUNTER — Ambulatory Visit: Payer: BC Managed Care – PPO | Attending: Obstetrics and Gynecology | Admitting: *Deleted

## 2020-09-29 DIAGNOSIS — Z3A27 27 weeks gestation of pregnancy: Secondary | ICD-10-CM

## 2020-09-29 DIAGNOSIS — O98519 Other viral diseases complicating pregnancy, unspecified trimester: Secondary | ICD-10-CM | POA: Insufficient documentation

## 2020-09-29 DIAGNOSIS — B009 Herpesviral infection, unspecified: Secondary | ICD-10-CM | POA: Diagnosis not present

## 2020-09-29 DIAGNOSIS — O98512 Other viral diseases complicating pregnancy, second trimester: Secondary | ICD-10-CM

## 2020-09-29 DIAGNOSIS — O365920 Maternal care for other known or suspected poor fetal growth, second trimester, not applicable or unspecified: Secondary | ICD-10-CM | POA: Diagnosis not present

## 2020-09-29 DIAGNOSIS — Z3480 Encounter for supervision of other normal pregnancy, unspecified trimester: Secondary | ICD-10-CM

## 2020-10-02 ENCOUNTER — Telehealth (INDEPENDENT_AMBULATORY_CARE_PROVIDER_SITE_OTHER): Payer: BC Managed Care – PPO

## 2020-10-02 DIAGNOSIS — Z3480 Encounter for supervision of other normal pregnancy, unspecified trimester: Secondary | ICD-10-CM

## 2020-10-02 DIAGNOSIS — O365990 Maternal care for other known or suspected poor fetal growth, unspecified trimester, not applicable or unspecified: Secondary | ICD-10-CM

## 2020-10-02 NOTE — Progress Notes (Signed)
Virtual ROB   NO ANSWER

## 2020-10-07 ENCOUNTER — Encounter: Payer: Self-pay | Admitting: *Deleted

## 2020-10-07 ENCOUNTER — Ambulatory Visit: Payer: BC Managed Care – PPO | Admitting: *Deleted

## 2020-10-07 ENCOUNTER — Other Ambulatory Visit: Payer: Self-pay

## 2020-10-07 ENCOUNTER — Ambulatory Visit: Payer: BC Managed Care – PPO | Attending: Obstetrics and Gynecology

## 2020-10-07 DIAGNOSIS — O403XX0 Polyhydramnios, third trimester, not applicable or unspecified: Secondary | ICD-10-CM

## 2020-10-07 DIAGNOSIS — O321XX0 Maternal care for breech presentation, not applicable or unspecified: Secondary | ICD-10-CM | POA: Diagnosis not present

## 2020-10-07 DIAGNOSIS — O365930 Maternal care for other known or suspected poor fetal growth, third trimester, not applicable or unspecified: Secondary | ICD-10-CM

## 2020-10-07 DIAGNOSIS — Z3A28 28 weeks gestation of pregnancy: Secondary | ICD-10-CM

## 2020-10-07 DIAGNOSIS — O365920 Maternal care for other known or suspected poor fetal growth, second trimester, not applicable or unspecified: Secondary | ICD-10-CM | POA: Insufficient documentation

## 2020-10-07 DIAGNOSIS — B009 Herpesviral infection, unspecified: Secondary | ICD-10-CM

## 2020-10-07 DIAGNOSIS — O98513 Other viral diseases complicating pregnancy, third trimester: Secondary | ICD-10-CM | POA: Diagnosis not present

## 2020-10-07 DIAGNOSIS — Z3480 Encounter for supervision of other normal pregnancy, unspecified trimester: Secondary | ICD-10-CM | POA: Diagnosis not present

## 2020-10-07 DIAGNOSIS — O365990 Maternal care for other known or suspected poor fetal growth, unspecified trimester, not applicable or unspecified: Secondary | ICD-10-CM | POA: Diagnosis not present

## 2020-10-08 ENCOUNTER — Other Ambulatory Visit: Payer: Self-pay | Admitting: *Deleted

## 2020-10-08 DIAGNOSIS — O365990 Maternal care for other known or suspected poor fetal growth, unspecified trimester, not applicable or unspecified: Secondary | ICD-10-CM

## 2020-10-14 ENCOUNTER — Ambulatory Visit: Payer: BC Managed Care – PPO

## 2020-10-20 ENCOUNTER — Encounter: Payer: Self-pay | Admitting: *Deleted

## 2020-10-20 ENCOUNTER — Ambulatory Visit: Payer: BC Managed Care – PPO | Admitting: *Deleted

## 2020-10-20 ENCOUNTER — Other Ambulatory Visit: Payer: Self-pay

## 2020-10-20 ENCOUNTER — Ambulatory Visit: Payer: BC Managed Care – PPO | Attending: Obstetrics and Gynecology

## 2020-10-20 DIAGNOSIS — Z3A30 30 weeks gestation of pregnancy: Secondary | ICD-10-CM

## 2020-10-20 DIAGNOSIS — O365930 Maternal care for other known or suspected poor fetal growth, third trimester, not applicable or unspecified: Secondary | ICD-10-CM | POA: Diagnosis not present

## 2020-10-20 DIAGNOSIS — O365920 Maternal care for other known or suspected poor fetal growth, second trimester, not applicable or unspecified: Secondary | ICD-10-CM | POA: Diagnosis not present

## 2020-10-20 DIAGNOSIS — Z3480 Encounter for supervision of other normal pregnancy, unspecified trimester: Secondary | ICD-10-CM | POA: Diagnosis not present

## 2020-10-20 DIAGNOSIS — O365990 Maternal care for other known or suspected poor fetal growth, unspecified trimester, not applicable or unspecified: Secondary | ICD-10-CM | POA: Insufficient documentation

## 2020-10-20 DIAGNOSIS — B009 Herpesviral infection, unspecified: Secondary | ICD-10-CM | POA: Diagnosis not present

## 2020-10-20 DIAGNOSIS — O322XX0 Maternal care for transverse and oblique lie, not applicable or unspecified: Secondary | ICD-10-CM

## 2020-10-20 DIAGNOSIS — O98513 Other viral diseases complicating pregnancy, third trimester: Secondary | ICD-10-CM | POA: Diagnosis not present

## 2020-10-21 ENCOUNTER — Other Ambulatory Visit: Payer: Self-pay | Admitting: *Deleted

## 2020-10-21 DIAGNOSIS — O365930 Maternal care for other known or suspected poor fetal growth, third trimester, not applicable or unspecified: Secondary | ICD-10-CM

## 2020-10-30 ENCOUNTER — Other Ambulatory Visit: Payer: Self-pay

## 2020-10-30 ENCOUNTER — Ambulatory Visit: Payer: BC Managed Care – PPO

## 2020-10-30 ENCOUNTER — Ambulatory Visit: Payer: BC Managed Care – PPO | Admitting: *Deleted

## 2020-10-30 ENCOUNTER — Encounter: Payer: Self-pay | Admitting: *Deleted

## 2020-10-30 ENCOUNTER — Ambulatory Visit: Payer: BC Managed Care – PPO | Attending: Obstetrics and Gynecology

## 2020-10-30 DIAGNOSIS — Z3480 Encounter for supervision of other normal pregnancy, unspecified trimester: Secondary | ICD-10-CM | POA: Diagnosis not present

## 2020-10-30 DIAGNOSIS — B009 Herpesviral infection, unspecified: Secondary | ICD-10-CM | POA: Diagnosis not present

## 2020-10-30 DIAGNOSIS — O98513 Other viral diseases complicating pregnancy, third trimester: Secondary | ICD-10-CM

## 2020-10-30 DIAGNOSIS — Z362 Encounter for other antenatal screening follow-up: Secondary | ICD-10-CM | POA: Diagnosis not present

## 2020-10-30 DIAGNOSIS — O365930 Maternal care for other known or suspected poor fetal growth, third trimester, not applicable or unspecified: Secondary | ICD-10-CM | POA: Diagnosis not present

## 2020-10-30 DIAGNOSIS — Z3A32 32 weeks gestation of pregnancy: Secondary | ICD-10-CM

## 2020-10-30 DIAGNOSIS — O365920 Maternal care for other known or suspected poor fetal growth, second trimester, not applicable or unspecified: Secondary | ICD-10-CM | POA: Insufficient documentation

## 2020-11-06 ENCOUNTER — Ambulatory Visit: Payer: BC Managed Care – PPO | Attending: Obstetrics and Gynecology

## 2020-11-06 ENCOUNTER — Ambulatory Visit: Payer: BC Managed Care – PPO | Admitting: *Deleted

## 2020-11-06 ENCOUNTER — Other Ambulatory Visit: Payer: Self-pay

## 2020-11-06 ENCOUNTER — Telehealth (INDEPENDENT_AMBULATORY_CARE_PROVIDER_SITE_OTHER): Payer: BC Managed Care – PPO | Admitting: Obstetrics and Gynecology

## 2020-11-06 ENCOUNTER — Encounter: Payer: Self-pay | Admitting: *Deleted

## 2020-11-06 ENCOUNTER — Encounter: Payer: Self-pay | Admitting: Obstetrics and Gynecology

## 2020-11-06 VITALS — BP 135/89 | HR 88 | Wt 144.0 lb

## 2020-11-06 DIAGNOSIS — O403XX0 Polyhydramnios, third trimester, not applicable or unspecified: Secondary | ICD-10-CM | POA: Diagnosis not present

## 2020-11-06 DIAGNOSIS — Z3480 Encounter for supervision of other normal pregnancy, unspecified trimester: Secondary | ICD-10-CM | POA: Insufficient documentation

## 2020-11-06 DIAGNOSIS — Z362 Encounter for other antenatal screening follow-up: Secondary | ICD-10-CM

## 2020-11-06 DIAGNOSIS — O365930 Maternal care for other known or suspected poor fetal growth, third trimester, not applicable or unspecified: Secondary | ICD-10-CM

## 2020-11-06 DIAGNOSIS — B009 Herpesviral infection, unspecified: Secondary | ICD-10-CM | POA: Diagnosis not present

## 2020-11-06 DIAGNOSIS — O98513 Other viral diseases complicating pregnancy, third trimester: Secondary | ICD-10-CM

## 2020-11-06 DIAGNOSIS — Z3A33 33 weeks gestation of pregnancy: Secondary | ICD-10-CM

## 2020-11-06 NOTE — Progress Notes (Signed)
OBSTETRICS PRENATAL VIRTUAL VISIT ENCOUNTER NOTE  Provider location: Center for [facility] [place]   I connected with Angela Taylor on 11/06/20 at  8:45 AM EST by MyChart Video Encounter at home and verified that I am speaking with the correct person using two identifiers.   I discussed the limitations, risks, security and privacy concerns of performing an evaluation and management service virtually and the availability of in person appointments. I also discussed with the patient that there may be a patient responsible charge related to this service. The patient expressed understanding and agreed to proceed. Subjective:  Angela Taylor is a 30 y.o. G2P0010 at [redacted]w[redacted]d being seen today for ongoing prenatal care.  She is currently monitored for the following issues for this high-risk pregnancy and has [redacted] weeks gestation of pregnancy; Supervision of other normal pregnancy, antepartum; and IUGR (intrauterine growth restriction) affecting care of mother on their problem list.  Patient reports no complaints.  Contractions: Irritability. Vag. Bleeding: None.  Movement: Present. Denies any leaking of fluid.   The following portions of the patient's history were reviewed and updated as appropriate: allergies, current medications, past family history, past medical history, past social history, past surgical history and problem list.   Objective:   Vitals:   11/06/20 0832  BP: 135/89  Pulse: 88  Weight: 144 lb (65.3 kg)    Fetal Status:     Movement: Present     General:  Alert, oriented and cooperative. Patient is in no acute distress.  Respiratory: Normal respiratory effort, no problems with respiration noted  Mental Status: Normal mood and affect. Normal behavior. Normal judgment and thought content.  Rest of physical exam deferred due to type of encounter  Imaging: [place] MFM FETAL BPP WO NON STRESS  Result Date:  10/30/2020 ----------------------------------------------------------------------  OBSTETRICS REPORT                       (Signed Final 10/30/2020 05:18 pm) ---------------------------------------------------------------------- Patient Info  ID #:       [id number]                          D.O.B.:  01-10-1991 (29 yrs)  Name:       Angela Taylor                  Visit Date: 10/30/2020 03:15 pm ---------------------------------------------------------------------- Performed By  Attending:        Lin Landsman      Ref. Address:     [address]                    [address]                                                             [address]                                                             [place], [place]  [address]  Performed By:     Tommie Raymond BS,       [facility]                                [facility]  Referred By:      Brock Bad MD ---------------------------------------------------------------------- Orders  #  Description                           Code        Ordered By  1  [place] MFM UA CORD DOPPLER                76820.02    RAVI SHANKAR  2  [place] MFM FETAL BPP WO NON               76819.01    YU FANG     STRESS ----------------------------------------------------------------------  #  Order #                     Accession #                Episode #  1  [id number]                   [id number]                 [id number]  2  [id number]                   [id number]                 [id number] ---------------------------------------------------------------------- Indications  [redacted] weeks gestation of pregnancy                Z3A.32  Maternal care for known or suspected poor      O36.5931  fetal growth, third trimester, fetus 1 IUGR   Herpes simplex virus (HSV)                     O98.519 B00.9  Negative AFP(Negative INVITAE)  Encounter for other antenatal screening        Z36.2  follow-up ---------------------------------------------------------------------- Fetal Evaluation  Num Of Fetuses:         1  Fetal Heart Rate(bpm):  143  Cardiac Activity:       Observed  Presentation:  Cephalic  Placenta:               Posterior  P. Cord Insertion:      Previously Visualized  Amniotic Fluid  AFI FV:      Subjectively upper-normal  AFI Sum(cm)     %Tile       Largest Pocket(cm)  26              96          8.9  RUQ(cm)       RLQ(cm)       LUQ(cm)        LLQ(cm)  8.9           5.4           6.3            5.4 ---------------------------------------------------------------------- Biophysical Evaluation  Amniotic F.V:   Pocket => 2 cm             F. Tone:        Observed  F. Movement:    Observed                   Score:          8/8  F. Breathing:   Observed ---------------------------------------------------------------------- Biometry  LV:          6  mm ---------------------------------------------------------------------- OB History  Gravidity:    2  Living:       0 ---------------------------------------------------------------------- Gestational Age  LMP:           32w 1d        Date:  03/19/20                 EDD:   12/24/20  Best:          32w 1d     Det. By:  LMP  (03/19/20)          EDD:   12/24/20 ---------------------------------------------------------------------- Anatomy  Cranium:               Previously seen        LVOT:                   Previously seen  Cavum:                 Previously seen        Aortic Arch:            Previously seen  Ventricles:            Appears normal         Ductal Arch:            Previously seen  Choroid Plexus:        Previously seen        Diaphragm:              Appears normal  Cerebellum:            Previously seen        Stomach:                Appears normal, left  sided  Posterior Fossa:       Previously seen        Abdomen:                Previously seen  Nuchal Fold:           Previously seen        Abdominal Wall:         Previously seen  Face:                  Orbits and profile     Cord Vessels:           Previously seen                         previously seen  Lips:                  Previously seen        Kidneys:                Appear normal  Palate:                Previously seen        Bladder:                Appears normal  Thoracic:              Previously seen        Spine:                  Previously seen  Heart:                 Appears normal         Upper Extremities:      Previously seen  RVOT:                  Previously seen        Lower Extremities:      Previously seen  Other:  Heels, open hands visualized previously. Female gender previously          seen. ---------------------------------------------------------------------- Doppler - Fetal Vessels  Umbilical Artery   S/D     %tile      RI    %tile                             ADFV    RDFV   2.33       28    0.57       31                                No      No ---------------------------------------------------------------------- Cervix Uterus Adnexa  Cervix  Not visualized (advanced GA >24wks)  Uterus  No abnormality visualized.  Right Ovary  Within normal limits.  Left Ovary  Within normal limits.  Cul De Sac  No free fluid seen.  Adnexa  No abnormality visualized. ---------------------------------------------------------------------- Impression  Antenatal testing due to IUGR with an EFW 5th% with an AC  < 22th%.  Biophysical profile 8/8 with good fetal movement and  amniotic fluid volume  UA Dopplers are normal with no evidence of AEDF or REDF ---------------------------------------------------------------------- Recommendations  Continue weekly testing with UA Dopplers.  Repeat growth in 3-4 weeks.  Determine delivery at next growth.  ----------------------------------------------------------------------  Lin Landsman, MD Electronically Signed Final Report   10/30/2020 05:18 pm ----------------------------------------------------------------------  [place] MFM OB FOLLOW UP  Result Date: 10/20/2020 ----------------------------------------------------------------------  OBSTETRICS REPORT                       (Signed Final 10/20/2020 03:56 pm) ---------------------------------------------------------------------- Patient Info  ID #:       [id number]                          D.O.B.:  08-12-1991 (29 yrs)  Name:       Angela Taylor                  Visit Date: 10/20/2020 02:36 pm ---------------------------------------------------------------------- Performed By  Attending:        Ma Rings MD         Ref. Address:     [address]                                                             [place], [place]                                                             [address]  Performed By:     Jenel Lucks     [facility]                    RDMS                                     [facility]  Referred By:      Brock Bad MD ---------------------------------------------------------------------- Orders  #  Description  Code        Ordered By  1  US MFM UA CORD DOPPLER                [id number]    RAVI SHANKAR  2  US MFM OB FOLLOW UP                   [id number]    [facility] ----------------------------------------------------------------------  #  Order #                     Accession #                Episode #  1  [id number]                   [id number]                 [id number]  2  [id number]                   [id number]                 [id number]  ---------------------------------------------------------------------- Indications  Maternal care for known or suspected poor      O36.5931  fetal growth, third trimester, fetus 1 IUGR  [redacted] weeks gestation of pregnancy                Z3A.30  Herpes simplex virus (HSV)                     O98.519 B00.9  Negative AFP(Negative INVITAE) ---------------------------------------------------------------------- Fetal Evaluation  Num Of Fetuses:         1  Fetal Heart Rate(bpm):  162  Cardiac Activity:       Observed  Presentation:           Transverse, head to maternal left  Placenta:               Posterior  P. Cord Insertion:      Previously Visualized  Amniotic Fluid  AFI FV:      Within normal limits  AFI Sum(cm)     %Tile       Largest Pocket(cm)  14.79           52          4.83  RUQ(cm)       RLQ(cm)       LUQ(cm)        LLQ(cm)  4.83          4.24          3.56           2.16 ---------------------------------------------------------------------- Biometry  BPD:      75.8  mm     G. Age:  30w 3d         29  %    CI:        72.66   %    70 - 86                                                          FL/HC:      18.0   %    19.3 - 21.3  HC:      282.8  mm  G. Age:  31w 0d         22  %    HC/AC:      1.10        0.96 - 1.17  AC:      257.1  mm     G. Age:  29w 6d         22  %    FL/BPD:     67.2   %    71 - 87  FL:       50.9  mm     G. Age:  27w 2d        < 1  %    FL/AC:      19.8   %    20 - 24  LV:        5.8  mm  Est. FW:    1349  gm           3 lb      5  % ---------------------------------------------------------------------- OB History  Gravidity:    2  Living:       0 ---------------------------------------------------------------------- Gestational Age  LMP:           30w 5d        Date:  03/19/20                 EDD:   12/24/20  U/S Today:     29w 5d                                        EDD:   12/31/20  Best:          30w 5d     Det. By:  LMP  (03/19/20)          EDD:   12/24/20  ---------------------------------------------------------------------- Anatomy  Cranium:               Previously seen        LVOT:                   Previously seen  Cavum:                 Previously seen        Aortic Arch:            Previously seen  Ventricles:            Appears normal         Ductal Arch:            Previously seen  Choroid Plexus:        Previously seen        Diaphragm:              Appears normal  Cerebellum:            Previously seen        Stomach:                Appears normal, left  sided  Posterior Fossa:       Previously seen        Abdomen:                Previously seen  Nuchal Fold:           Previously seen        Abdominal Wall:         Previously seen  Face:                  Orbits and profile     Cord Vessels:           Previously seen                         previously seen  Lips:                  Previously seen        Kidneys:                Appear normal  Palate:                Previously seen        Bladder:                Appears normal  Thoracic:              Previously seen        Spine:                  Previously seen  Heart:                 Appears normal         Upper Extremities:      Previously seen  RVOT:                  Previously seen        Lower Extremities:      Previously seen  Other:  Heels, open hands visualized previously. Female gender previously          seen. ---------------------------------------------------------------------- Doppler - Fetal Vessels  Umbilical Artery   S/D     %tile      RI    %tile                             ADFV    RDFV   3.08       67    0.68       74                                No      No ---------------------------------------------------------------------- Cervix Uterus Adnexa  Cervix  Not visualized (advanced GA >24wks) ---------------------------------------------------------------------- Comments  This patient was seen for a follow up growth scan due  to fetal  growth restriction noted during her prior ultrasound exams.  She denies any problems since her last exam and reports  feeling vigorous fetal movements throughout the day.  On today's exam, the EFW measures at the 5th percentile for  her gestational age indicating fetal growth restriction.  The  fetus has grown 1 pound over the past 3 weeks.  There was  normal amniotic fluid noted.  Fetal movements were noted throughout today's ultrasound  exam.  Doppler studies of the umbilical arteries showed  a normal  S/D ratio of 3.08.  There were no signs of absent or reversed  end-diastolic flow.  Due to fetal growth restriction, we will continue to follow her  with weekly fetal testing and umbilical artery Doppler studies.  A biophysical profile and umbilical artery Doppler study was  scheduled in 1 week.  We will reassess the fetal growth again in 3 weeks. ----------------------------------------------------------------------                   Ma RingsVictor Fang, MD Electronically Signed Final Report   10/20/2020 03:56 pm ----------------------------------------------------------------------  US MFM UA CORD DOPPLER  Result Date: 10/30/2020 ----------------------------------------------------------------------  OBSTETRICS REPORT                       (Signed Final 10/30/2020 05:18 pm) ---------------------------------------------------------------------- Patient Info  ID #:       [id number]                          D.O.B.:  04-19-1991 (29 yrs)  Name:       Angela PiqueNELIBETH Musleh                  Visit Date: 10/30/2020 03:15 pm ---------------------------------------------------------------------- Performed By  Attending:        Lin Landsmanorenthian Booker      Ref. Address:     [address]                    [address]                                                             [place], [place]                                                             [id number]  Performed By:      Tommie RaymondMesha Tester BS,       [facility] RVT                                [facility]  Referred By:      Brock Bad MD ---------------------------------------------------------------------- Orders  #  Description                           Code        Ordered By  1  [place] MFM UA CORD DOPPLER                76820.02    RAVI SHANKAR  2  [place] MFM FETAL BPP WO NON               76819.01    YU FANG     STRESS ----------------------------------------------------------------------  #  Order #                     Accession #                Episode #  1  [id number]                   [id number]                 [id number]  2  [id number]                   [id number]                 [id number] ---------------------------------------------------------------------- Indications  [redacted] weeks gestation of pregnancy                Z3A.32  Maternal care for known or suspected poor      O36.5931  fetal growth, third trimester, fetus 1 IUGR  Herpes simplex virus (HSV)                     O98.519 B00.9  Negative AFP(Negative INVITAE)  Encounter for other antenatal screening        Z36.2  follow-up ---------------------------------------------------------------------- Fetal Evaluation  Num Of Fetuses:         1  Fetal Heart Rate(bpm):  143  Cardiac Activity:       Observed  Presentation:           Cephalic  Placenta:               Posterior  P. Cord Insertion:      Previously Visualized  Amniotic Fluid  AFI FV:      Subjectively upper-normal  AFI Sum(cm)     %Tile       Largest Pocket(cm)  26              96          8.9  RUQ(cm)       RLQ(cm)       LUQ(cm)        LLQ(cm)  8.9           5.4           6.3            5.4 ---------------------------------------------------------------------- Biophysical Evaluation  Amniotic F.V:   Pocket => 2  cm             F. Tone:        Observed  F. Movement:    Observed  Score:          8/8  F. Breathing:   Observed ---------------------------------------------------------------------- Biometry  LV:          6  mm ---------------------------------------------------------------------- OB History  Gravidity:    2  Living:       0 ---------------------------------------------------------------------- Gestational Age  LMP:           32w 1d        Date:  03/19/20                 EDD:   12/24/20  Best:          32w 1d     Det. By:  LMP  (03/19/20)          EDD:   12/24/20 ---------------------------------------------------------------------- Anatomy  Cranium:               Previously seen        LVOT:                   Previously seen  Cavum:                 Previously seen        Aortic Arch:            Previously seen  Ventricles:            Appears normal         Ductal Arch:            Previously seen  Choroid Plexus:        Previously seen        Diaphragm:              Appears normal  Cerebellum:            Previously seen        Stomach:                Appears normal, left                                                                        sided  Posterior Fossa:       Previously seen        Abdomen:                Previously seen  Nuchal Fold:           Previously seen        Abdominal Wall:         Previously seen  Face:                  Orbits and profile     Cord Vessels:           Previously seen                         previously seen  Lips:                  Previously seen        Kidneys:                Appear normal  Palate:  Previously seen        Bladder:                Appears normal  Thoracic:              Previously seen        Spine:                  Previously seen  Heart:                 Appears normal         Upper Extremities:      Previously seen  RVOT:                  Previously seen        Lower Extremities:      Previously seen  Other:  Heels, open hands  visualized previously. Female gender previously          seen. ---------------------------------------------------------------------- Doppler - Fetal Vessels  Umbilical Artery   S/D     %tile      RI    %tile                             ADFV    RDFV   2.33       28    0.57       31                                No      No ---------------------------------------------------------------------- Cervix Uterus Adnexa  Cervix  Not visualized (advanced GA >24wks)  Uterus  No abnormality visualized.  Right Ovary  Within normal limits.  Left Ovary  Within normal limits.  Cul De Sac  No free fluid seen.  Adnexa  No abnormality visualized. ---------------------------------------------------------------------- Impression  Antenatal testing due to IUGR with an EFW 5th% with an AC  < 22th%.  Biophysical profile 8/8 with good fetal movement and  amniotic fluid volume  UA Dopplers are normal with no evidence of AEDF or REDF ---------------------------------------------------------------------- Recommendations  Continue weekly testing with UA Dopplers.  Repeat growth in 3-4 weeks.  Determine delivery at next growth. ----------------------------------------------------------------------               Lin Landsman, MD Electronically Signed Final Report   10/30/2020 05:18 pm ----------------------------------------------------------------------  [place] MFM UA CORD DOPPLER  Result Date: 10/20/2020 ----------------------------------------------------------------------  OBSTETRICS REPORT                       (Signed Final 10/20/2020 03:56 pm) ---------------------------------------------------------------------- Patient Info  ID #:       [id number]                          D.O.B.:  04/27/1991 (29 yrs)  Name:       Angela Taylor                  Visit Date: 10/20/2020 02:36 pm ---------------------------------------------------------------------- Performed By  Attending:        Ma Rings MD         Ref. Address:     [address]                                                             [place], [place]                                                             [address]  Performed By:     Jenel Lucks     [facility]                    RD[facility]                                     [facility]  Referred By:      Brock Bad MD ---------------------------------------------------------------------- Orders  #  Description                           Code        Ordered By  1  [place] MFM UA CORD DOPPLER                76820.02    RAVI SHANKAR  2  [place] MFM OB FOLLOW UP                   76816.01    RAVI [facility] ----------------------------------------------------------------------  #  Order #                     Accession #                Episode #  1  [id number]                   [id number]                 [id number]  2  [id number]                   [id number]                 [id number] ---------------------------------------------------------------------- Indications  Maternal care for known or  suspected poor      O36.5931  fetal growth, third trimester, fetus 1 IUGR  [redacted] weeks gestation of pregnancy                Z3A.30  Herpes simplex virus (HSV)                     O98.519 B00.9  Negative AFP(Negative INVITAE) ---------------------------------------------------------------------- Fetal Evaluation  Num Of Fetuses:         1  Fetal Heart Rate(bpm):  162  Cardiac Activity:       Observed  Presentation:           Transverse, head to maternal left  Placenta:               Posterior  P. Cord Insertion:      Previously Visualized  Amniotic Fluid  AFI FV:      Within normal limits  AFI Sum(cm)     %Tile       Largest Pocket(cm)  14.79           52          4.83  RUQ(cm)       RLQ(cm)       LUQ(cm)         LLQ(cm)  4.83          4.24          3.56           2.16 ---------------------------------------------------------------------- Biometry  BPD:      75.8  mm     G. Age:  30w 3d         29  %    CI:        72.66   %    70 - 86                                                          FL/HC:      18.0   %    19.3 - 21.3  HC:      282.8  mm     G. Age:  31w 0d         22  %    HC/AC:      1.10        0.96 - 1.17  AC:      257.1  mm     G. Age:  29w 6d         22  %    FL/BPD:     67.2   %    71 - 87  FL:       50.9  mm     G. Age:  27w 2d        < 1  %    FL/AC:      19.8   %    20 - 24  LV:        5.8  mm  Est. FW:    1349  gm           3 lb      5  % ---------------------------------------------------------------------- OB History  Gravidity:    2  Living:       0 ---------------------------------------------------------------------- Gestational Age  LMP:  30w 5d        Date:  03/19/20                 EDD:   12/24/20  U/S Today:     29w 5d                                        EDD:   12/31/20  Best:          30w 5d     Det. By:  LMP  (03/19/20)          EDD:   12/24/20 ---------------------------------------------------------------------- Anatomy  Cranium:               Previously seen        LVOT:                   Previously seen  Cavum:                 Previously seen        Aortic Arch:            Previously seen  Ventricles:            Appears normal         Ductal Arch:            Previously seen  Choroid Plexus:        Previously seen        Diaphragm:              Appears normal  Cerebellum:            Previously seen        Stomach:                Appears normal, left                                                                        sided  Posterior Fossa:       Previously seen        Abdomen:                Previously seen  Nuchal Fold:           Previously seen        Abdominal Wall:         Previously seen  Face:                  Orbits and profile     Cord Vessels:           Previously seen                          previously seen  Lips:                  Previously seen        Kidneys:                Appear normal  Palate:                Previously seen  Bladder:                Appears normal  Thoracic:              Previously seen        Spine:                  Previously seen  Heart:                 Appears normal         Upper Extremities:      Previously seen  RVOT:                  Previously seen        Lower Extremities:      Previously seen  Other:  Heels, open hands visualized previously. Female gender previously          seen. ---------------------------------------------------------------------- Doppler - Fetal Vessels  Umbilical Artery   S/D     %tile      RI    %tile                             ADFV    RDFV   3.08       67    0.68       74                                No      No ---------------------------------------------------------------------- Cervix Uterus Adnexa  Cervix  Not visualized (advanced GA >24wks) ---------------------------------------------------------------------- Comments  This patient was seen for a follow up growth scan due to fetal  growth restriction noted during her prior ultrasound exams.  She denies any problems since her last exam and reports  feeling vigorous fetal movements throughout the day.  On today's exam, the EFW measures at the 5th percentile for  her gestational age indicating fetal growth restriction.  The  fetus has grown 1 pound over the past 3 weeks.  There was  normal amniotic fluid noted.  Fetal movements were noted throughout today's ultrasound  exam.  Doppler studies of the umbilical arteries showed a normal  S/D ratio of 3.08.  There were no signs of absent or reversed  end-diastolic flow.  Due to fetal growth restriction, we will continue to follow her  with weekly fetal testing and umbilical artery Doppler studies.  A biophysical profile and umbilical artery Doppler study was  scheduled in 1 week.  We will reassess the fetal growth  again in 3 weeks. ----------------------------------------------------------------------                   Ma Rings, MD Electronically Signed Final Report   10/20/2020 03:56 pm ----------------------------------------------------------------------  [place] MFM UA CORD DOPPLER  Result Date: 10/07/2020 ----------------------------------------------------------------------  OBSTETRICS REPORT                       (Signed Final 10/07/2020 04:47 pm) ---------------------------------------------------------------------- Patient Info  ID #:       [id number]                          D.O.B.:  1991/06/01 (29 yrs)  Name:       Angela Taylor  Visit Date: 10/07/2020 03:45 pm ---------------------------------------------------------------------- Performed By  Attending:        Noralee Space MD        Ref. Address:     [address]                                                             [place], [place]                                                             [address]  Performed By:     Fayne Norrie BS,      [facility], [facility]                                [facility]  Referred By:      Brock Bad MD ---------------------------------------------------------------------- Orders  #  Description                           Code        Ordered By  1  [place] MFM UA CORD DOPPLER                [phone]    [facility] ----------------------------------------------------------------------  #  Order #                     Accession #                Episode #  1  [id number]                   [id number]  [id number] ---------------------------------------------------------------------- Indications  Maternal care for known or  suspected poor      O36.5921  fetal growth, second trimester, fetus 1 IUGR  Herpes simplex virus (HSV)                     O98.519 B00.9  Negative AFP(Negative INVITAE)  [redacted] weeks gestation of pregnancy                Z3A.28 ---------------------------------------------------------------------- Fetal Evaluation  Num Of Fetuses:         1  Fetal Heart Rate(bpm):  164  Cardiac Activity:       Observed  Presentation:           Breech  Placenta:               Posterior  P. Cord Insertion:      Previously Visualized  Amniotic Fluid  AFI FV:      Within normal limits  AFI Sum(cm)     %Tile       Largest Pocket(cm)  20.74           83          7.22  RUQ(cm)       RLQ(cm)       LUQ(cm)        LLQ(cm)  5.75          4.55          7.22           3.22 ---------------------------------------------------------------------- Biometry  LV:          5  mm ---------------------------------------------------------------------- OB History  Gravidity:    2  Living:       0 ---------------------------------------------------------------------- Gestational Age  LMP:           28w 6d        Date:  03/19/20                 EDD:   12/24/20  Best:          Eden Emms 6d     Det. By:  LMP  (03/19/20)          EDD:   12/24/20 ---------------------------------------------------------------------- Anatomy  Heart:                 Appears normal         Kidneys:                Appear normal                         (4CH, axis, and                         situs)  Stomach:               Appears normal, left   Bladder:                Appears normal                         sided ---------------------------------------------------------------------- Doppler - Fetal Vessels  Umbilical Artery   S/D     %tile      RI    %tile   2.15        8    0.53      4.6 ---------------------------------------------------------------------- Cervix Uterus Adnexa  Cervix  Length:  3.71  cm.  Normal appearance by transabdominal scan.  ---------------------------------------------------------------------- Impression  Fetal growth restriction.  Patient return for umbilical artery  Doppler study.  Mild polyhydramnios was seen on previous  ultrasound.  On today's ultrasound, amniotic fluid is normal and good fetal  activity seen.  Umbilical artery Doppler showed normal  forward diastolic flow.  We reassured the patient of the findings ---------------------------------------------------------------------- Recommendations  Patient has an appointment for fetal growth assessment and  UA Doppler study on 10/20/2020 (cancelled 10/14/20  appointment) ----------------------------------------------------------------------                  Noralee Space, MD Electronically Signed Final Report   10/07/2020 04:47 pm ----------------------------------------------------------------------   Assessment and Plan:  Pregnancy: G2P0010 at [redacted]w[redacted]d 1. Supervision of other normal pregnancy, antepartum Patient is doing well and is without complaints Patient remains undecided on pediatrician  2. Poor fetal growth affecting management of mother in third trimester, single or unspecified fetus Follow up growth with MFM and timing of delivery Continue weekly antenatal testing  Preterm labor symptoms and general obstetric precautions including but not limited to vaginal bleeding, contractions, leaking of fluid and fetal movement were reviewed in detail with the patient. I discussed the assessment and treatment plan with the patient. The patient was provided an opportunity to ask questions and all were answered. The patient agreed with the plan and demonstrated an understanding of the instructions. The patient was advised to call back or seek an in-person office evaluation/go to MAU at [facility] for any urgent or concerning symptoms. Please refer to After Visit Summary for other counseling recommendations.   I provided 15 minutes of face-to-face time  during this encounter.  Return in about 2 weeks (around 11/20/2020) for in person, ROB, High risk.  Future Appointments  Date Time Provider Department Center  11/06/2020  8:45 AM Kru Allman, Gigi Gin, MD [facility] None  11/06/2020  3:30 PM [facility] NURSE [facility]  11/06/2020  3:45 PM [facility] US4 [facility]  11/13/2020  3:00 PM [facility] NURSE [facility]  11/13/2020  3:15 PM [facility] US2 [facility]    Catalina Antigua, MD [facility] [employer], [facility]

## 2020-11-06 NOTE — Progress Notes (Signed)
I connected with Angela Taylor on 11/06/20 at  8:45 AM EST by telephone and verified that I am speaking with the correct person using two identifiers.  Pt c/o hip pain.

## 2020-11-07 ENCOUNTER — Inpatient Hospital Stay (HOSPITAL_COMMUNITY)
Admission: AD | Admit: 2020-11-07 | Discharge: 2020-11-07 | Disposition: A | Discharge status: Home / Self Care | Payer: BC Managed Care – PPO | Attending: Family Medicine | Admitting: Family Medicine

## 2020-11-07 ENCOUNTER — Other Ambulatory Visit: Payer: Self-pay

## 2020-11-07 ENCOUNTER — Encounter (HOSPITAL_COMMUNITY): Payer: Self-pay | Admitting: Family Medicine

## 2020-11-07 ENCOUNTER — Other Ambulatory Visit: Payer: Self-pay | Admitting: *Deleted

## 2020-11-07 DIAGNOSIS — Z87891 Personal history of nicotine dependence: Secondary | ICD-10-CM | POA: Diagnosis not present

## 2020-11-07 DIAGNOSIS — Z3A33 33 weeks gestation of pregnancy: Secondary | ICD-10-CM | POA: Insufficient documentation

## 2020-11-07 DIAGNOSIS — Z3480 Encounter for supervision of other normal pregnancy, unspecified trimester: Secondary | ICD-10-CM

## 2020-11-07 DIAGNOSIS — O133 Gestational [pregnancy-induced] hypertension without significant proteinuria, third trimester: Secondary | ICD-10-CM | POA: Diagnosis not present

## 2020-11-07 DIAGNOSIS — O368130 Decreased fetal movements, third trimester, not applicable or unspecified: Secondary | ICD-10-CM

## 2020-11-07 DIAGNOSIS — O365930 Maternal care for other known or suspected poor fetal growth, third trimester, not applicable or unspecified: Secondary | ICD-10-CM

## 2020-11-07 LAB — COMPREHENSIVE METABOLIC PANEL
ALT: 19 U/L (ref 0–44)
AST: 17 U/L (ref 15–41)
Albumin: 2.6 g/dL — ABNORMAL LOW (ref 3.5–5.0)
Alkaline Phosphatase: 120 U/L (ref 38–126)
Anion gap: 11 (ref 5–15)
BUN: 8 mg/dL (ref 6–20)
CO2: 21 mmol/L — ABNORMAL LOW (ref 22–32)
Calcium: 8.7 mg/dL — ABNORMAL LOW (ref 8.9–10.3)
Chloride: 104 mmol/L (ref 98–111)
Creatinine, Ser: 0.49 mg/dL (ref 0.44–1.00)
GFR, Estimated: >60 mL/min (ref >60)
Glucose, Bld: 112 mg/dL — ABNORMAL HIGH (ref 70–99)
Potassium: 3.6 mmol/L (ref 3.5–5.1)
Sodium: 136 mmol/L (ref 135–145)
Total Bilirubin: 0.4 mg/dL (ref 0.3–1.2)
Total Protein: 5.8 g/dL — ABNORMAL LOW (ref 6.5–8.1)

## 2020-11-07 LAB — URINALYSIS, ROUTINE W REFLEX MICROSCOPIC
Bilirubin Urine: NEGATIVE
Glucose, UA: NEGATIVE mg/dL
Hgb urine dipstick: NEGATIVE
Ketones, ur: NEGATIVE mg/dL
Leukocytes,Ua: NEGATIVE
Nitrite: NEGATIVE
Protein, ur: NEGATIVE mg/dL
Specific Gravity, Urine: 1.005 (ref 1.005–1.030)
pH: 6 (ref 5.0–8.0)

## 2020-11-07 LAB — CBC WITH DIFFERENTIAL/PLATELET
Abs Immature Granulocytes: 0.03 10*3/uL (ref 0.00–0.07)
Basophils Absolute: 0 10*3/uL (ref 0.0–0.1)
Basophils Relative: 0 %
Eosinophils Absolute: 0.3 10*3/uL (ref 0.0–0.5)
Eosinophils Relative: 3 %
HCT: 33.4 % — ABNORMAL LOW (ref 36.0–46.0)
Hemoglobin: 11.3 g/dL — ABNORMAL LOW (ref 12.0–15.0)
Immature Granulocytes: 0 %
Lymphocytes Relative: 24 %
Lymphs Abs: 1.9 10*3/uL (ref 0.7–4.0)
MCH: 33.2 pg (ref 26.0–34.0)
MCHC: 33.8 g/dL (ref 30.0–36.0)
MCV: 98.2 fL (ref 80.0–100.0)
Monocytes Absolute: 0.5 10*3/uL (ref 0.1–1.0)
Monocytes Relative: 7 %
Neutro Abs: 5.4 10*3/uL (ref 1.7–7.7)
Neutrophils Relative %: 66 %
Platelets: 252 10*3/uL (ref 150–400)
RBC: 3.4 MIL/uL — ABNORMAL LOW (ref 3.87–5.11)
RDW: 13.2 % (ref 11.5–15.5)
WBC: 8.1 10*3/uL (ref 4.0–10.5)
nRBC: 0 % (ref 0.0–0.2)

## 2020-11-07 LAB — PROTEIN / CREATININE RATIO, URINE
Creatinine, Urine: 29.21 mg/dL
Total Protein, Urine: <6 mg/dL

## 2020-11-07 MED ORDER — BETAMETHASONE SOD PHOS & ACET 6 (3-3) MG/ML IJ SUSP
12.0000 mg | Freq: Once | INTRAMUSCULAR | Status: AC
  Administered 2020-11-07: 12 mg via INTRAMUSCULAR
  Filled 2020-11-07: qty 5

## 2020-11-07 NOTE — MAU Provider Note (Signed)
Chief Complaint:  Blood Pressure Evaluation   Event Date/Time   First Provider Initiated Contact with Patient 11/07/20 1903     HPI: Angela Taylor is a 30 y.o. G2P0010 at [redacted]w[redacted]d who presents to maternity admissions reporting hypertension. Denies vaginal bleeding, leaking of fluid, decreased fetal movement, fever, falls, or recent illness.    Chart review of clinic and MFM vitals show that she has had new onset HTN noted since her visit with MFM on 10/20/2020. No documentation from the RN at that time or future MFM visits that this was addressed. Up until this visit on 10/20/2020 patient had not had any hypertension. Patient denies any hx of CHTN. She denies headache, visual disturbance or RUQ pain.   Patient reports that she took her blood pressure at home last night because she felt "swollen". At home her blood pressure was 150s/100s last night and this morning.   11/06/20 [redacted]w[redacted]d 131/90/ 70       11/06/20 [redacted]w[redacted]d 132/94/ 70   / / Present/ Absent/ Not present / / / None   11/06/20 [redacted]w[redacted]d 135/89/ 88/ 65.3 kg  None / / Present/ Present/ Irritability / / / None   10/30/20 [redacted]w[redacted]d 124/89/ 72       10/30/20 [redacted]w[redacted]d 136/97/ 71   / / Present/ Absent/ Not present / / / None   10/20/20 [redacted]w[redacted]d 130/93 72   / / Present/ Absent/ Not present / / / None   10/07/20 [redacted]w[redacted]d 126/84/ 75   / / Present/ Absent/ Not present / / / None   09/29/20 [redacted]w[redacted]d 128/69/ 79   / / Present/ Absent/ Not present / / / None   09/22/20 [redacted]w[redacted]d 126/79/ 69   / / Present/ Absent/ Not present / / / None   09/18/20 [redacted]w[redacted]d 121/80/ 74/ 59.9 kg   27 cm/ 140/ Present/ Absent/ Not present / / / None   09/08/20 [redacted]w[redacted]d 115/65/ 75   / / Present/ Absent/ Not present / / / None   08/21/20 [redacted]w[redacted]d   None / / Present/ Present/ Not present / / / None   07/23/20 [redacted]w[redacted]d 119/83/ 84/ 55.2 kg  None / / Present/ Absent/ Not present / / / None      Pregnancy Course: pregnancy has  been complicated by IUGR. She seems MFM weekly with growth and BPP ultrasounds.   Past Medical History:  Diagnosis Date  . Angio-edema   . STD (sexually transmitted disease) 2014   Herpes- has not had outbreak  . Urticaria    OB History  Gravida Para Term Preterm AB Living  2 0 0 0 1 0  SAB IAB Ectopic Multiple Live Births  1 0 0 0 0    # Outcome Date GA Lbr Len/2nd Weight Sex Delivery Anes PTL Lv  2 Current           1 SAB 04/20/19 [redacted]w[redacted]d          Past Surgical History:  Procedure Laterality Date  . WISDOM TOOTH EXTRACTION     Family History  Problem Relation Age of Onset  . Diabetes Mother   . Kidney Stones Mother   . Asthma Mother   . Allergic rhinitis Mother   . Diabetes Brother   . Allergic rhinitis Brother   . Asthma Brother   . Bipolar disorder Maternal Aunt   . Dementia Maternal Grandmother   . Bipolar disorder Brother   . Allergic rhinitis Brother   . Asthma Brother   . Schizophrenia Cousin  Social History   Tobacco Use  . Smoking status: Former Smoker    Types: Cigarettes    Quit date: 10/18/2017    Years since quitting: 3.0  . Smokeless tobacco: Never Used  Vaping Use  . Vaping Use: Former  . Substances: Nicotine  Substance Use Topics  . Alcohol use: Not Currently    Comment: occasional   . Drug use: No   Allergies  Allergen Reactions  . Penicillins Hives    Did it involve swelling of the face/tongue/throat, SOB, or low BP? No Did it involve sudden or severe rash/hives, skin peeling, or any reaction on the inside of your mouth or nose? No Did you need to seek medical attention at a hospital or doctor's office? No When did it last happen? If all above answers are "NO", may proceed with cephalosporin use.   Marland Kitchen Pistachio Nut (Diagnostic) Itching  . Shrimp [Shellfish Allergy] Itching   Medications Prior to Admission  Medication Sig Dispense Refill Last Dose  . Prenatal Vit-Fe Fumarate-FA (PRENATAL MULTIVITAMIN) TABS tablet Take 1 tablet  by mouth daily at 12 noon.   11/06/2020 at Unknown time    I have reviewed patient's Past Medical Hx, Surgical Hx, Family Hx, Social Hx, medications and allergies.   ROS:  Review of Systems  Constitutional: Negative for chills and fever.  Genitourinary: Negative for pelvic pain, vaginal bleeding and vaginal discharge.  Neurological: Negative for headaches.    Physical Exam   Patient Vitals for the past 24 hrs:  BP Temp Temp src Pulse Resp SpO2 Height Weight  11/07/20 1910 -- -- -- -- -- 100 % -- --  11/07/20 1905 -- -- -- -- -- 100 % -- --  11/07/20 1901 (!) 147/99 -- -- 66 -- -- -- --  11/07/20 1900 -- -- -- -- -- 100 % -- --  11/07/20 1855 -- -- -- -- -- 100 % -- --  11/07/20 1828 (!) 153/95 98.4 F (36.9 C) Oral 72 20 100 % -- --  11/07/20 1823 -- -- -- -- -- -- 5\' 1"  (1.549 m) 66.2 kg  11/07/20 1736 (!) 151/105 98.5 F (36.9 C) Oral 83 18 100 % -- --    Constitutional: Well-developed, well-nourished female in no acute distress.  Cardiovascular: normal rate & rhythm, no murmur Respiratory: normal effort, lung sounds clear throughout GI: Abd soft, non-tender, gravid appropriate for gestational age. Pos BS x 4 MS: Extremities nontender, no edema, normal ROM Neurologic: Alert and oriented x 4.  GU: no CVA tenderness Pelvic: NEFG, physiologic discharge, no blood, cervix clean.      NST:  Baseline: 145 Variability: moderate Accels: 15x15 Decels: none Toco: some UI  Reactive/Appropriate for GA  Labs: Results for orders placed or performed during the hospital encounter of 11/07/20 (from the past 24 hour(s))  Comprehensive metabolic panel     Status: Abnormal   Collection Time: 11/07/20  5:43 PM  Result Value Ref Range   Sodium 136 135 - 145 mmol/L   Potassium 3.6 3.5 - 5.1 mmol/L   Chloride 104 98 - 111 mmol/L   CO2 21 (L) 22 - 32 mmol/L   Glucose, Bld 112 (H) 70 - 99 mg/dL   BUN 8 6 - 20 mg/dL   Creatinine, Ser 11/09/20 0.44 - 1.00 mg/dL   Calcium 8.7 (L) 8.9 - 10.3  mg/dL   Total Protein 5.8 (L) 6.5 - 8.1 g/dL   Albumin 2.6 (L) 3.5 - 5.0 g/dL   AST 17 15 -  41 U/L   ALT 19 0 - 44 U/L   Alkaline Phosphatase 120 38 - 126 U/L   Total Bilirubin 0.4 0.3 - 1.2 mg/dL   GFR, Estimated >29>60 >51>60 mL/min   Anion gap 11 5 - 15  CBC with Differential     Status: Abnormal   Collection Time: 11/07/20  5:43 PM  Result Value Ref Range   WBC 8.1 4.0 - 10.5 K/uL   RBC 3.40 (L) 3.87 - 5.11 MIL/uL   Hemoglobin 11.3 (L) 12.0 - 15.0 g/dL   HCT 88.433.4 (L) 16.636.0 - 06.346.0 %   MCV 98.2 80.0 - 100.0 fL   MCH 33.2 26.0 - 34.0 pg   MCHC 33.8 30.0 - 36.0 g/dL   RDW 01.613.2 01.011.5 - 93.215.5 %   Platelets 252 150 - 400 K/uL   nRBC 0.0 0.0 - 0.2 %   Neutrophils Relative % 66 %   Neutro Abs 5.4 1.7 - 7.7 K/uL   Lymphocytes Relative 24 %   Lymphs Abs 1.9 0.7 - 4.0 K/uL   Monocytes Relative 7 %   Monocytes Absolute 0.5 0.1 - 1.0 K/uL   Eosinophils Relative 3 %   Eosinophils Absolute 0.3 0.0 - 0.5 K/uL   Basophils Relative 0 %   Basophils Absolute 0.0 0.0 - 0.1 K/uL   Immature Granulocytes 0 %   Abs Immature Granulocytes 0.03 0.00 - 0.07 K/uL  Urinalysis, Routine w reflex microscopic Urine, Clean Catch     Status: Abnormal   Collection Time: 11/07/20  6:50 PM  Result Value Ref Range   Color, Urine STRAW (A) YELLOW   APPearance CLEAR CLEAR   Specific Gravity, Urine 1.005 1.005 - 1.030   pH 6.0 5.0 - 8.0   Glucose, UA NEGATIVE NEGATIVE mg/dL   Hgb urine dipstick NEGATIVE NEGATIVE   Bilirubin Urine NEGATIVE NEGATIVE   Ketones, ur NEGATIVE NEGATIVE mg/dL   Protein, ur NEGATIVE NEGATIVE mg/dL   Nitrite NEGATIVE NEGATIVE   Leukocytes,Ua NEGATIVE NEGATIVE  Protein / creatinine ratio, urine     Status: None   Collection Time: 11/07/20  6:50 PM  Result Value Ref Range   Creatinine, Urine 29.21 mg/dL   Total Protein, Urine <6 mg/dL   Protein Creatinine Ratio        0.00 - 0.15 mg/mg[Cre]    Imaging:  No results found.  MAU Course: Orders Placed This Encounter  Procedures  .  Comprehensive metabolic panel  . CBC with Differential  . Urinalysis, Routine w reflex microscopic Urine, Clean Catch  . Protein / creatinine ratio, urine  . Saline lock IV  . Discharge patient   Meds ordered this encounter  Medications  . betamethasone acetate-betamethasone sodium phosphate (CELESTONE) injection 12 mg    MDM:  - DW patient that I recommend BMZ injection today as the likelihood of early delivery whether she has GHTN or Pre-eclampsia is high.   Assessment: 1. Supervision of other normal pregnancy, antepartum   2. Gestational hypertension, third trimester   3. [redacted] weeks gestation of pregnancy   4. Poor fetal growth affecting management of mother in third trimester, single or unspecified fetus     Plan: Discharge home in stable condition.  No proteinuria today  Pre-eclampsia warning signs reviewed  3rd Trimester precautions  PTL precautions  Fetal kick counts RX: none  Return to MAU as needed Return in MAU in 24 hours for BMZ    Follow-up Information    [facility] Follow up.  Specialty: Obstetrics and Gynecology Why: Return tomorrow evening for your 2nd steroid injection  Contact information: [address] [id number] mc [place] [address] [phone]              Allergies as of 11/07/2020      Reactions   Penicillins Hives   Did it involve swelling of the face/tongue/throat, SOB, or low BP? No Did it involve sudden or severe rash/hives, skin peeling, or any reaction on the inside of your mouth or nose? No Did you need to seek medical attention at a hospital or doctor's office? No When did it last happen? If all above answers are "NO", may proceed with cephalosporin use.   Pistachio Nut (diagnostic) Itching   Shrimp [shellfish Allergy] Itching      Medication List    TAKE these medications   prenatal multivitamin Tabs tablet Take 1 tablet by mouth daily at 12 noon.       Thressa Sheller DNP, CNM  11/07/20  8:06 PM

## 2020-11-07 NOTE — MAU Note (Signed)
Presents with c/o increased BP, took BP @ home and has been elevated x3 days.  Presented to [facility] ED and BP 151/105.  Denies current H/A, visual disturbances or epigastric pain.  Denies VB or LOF.  Endorses +FM.

## 2020-11-07 NOTE — Discharge Instructions (Signed)
Preeclampsia and Eclampsia Preeclampsia is a serious condition that may develop during pregnancy. This condition involves high blood pressure during pregnancy and causes symptoms such as headaches, vision changes, and increased swelling in the legs, hands, and face. Preeclampsia occurs after 20 weeks of pregnancy. Eclampsia is a seizure that happens from worsening preeclampsia. Diagnosing and managing preeclampsia early is important. If not treated early, it can cause serious problems for mother and baby. There is no cure for this condition. However, during pregnancy, delivering the baby may be the best treatment for preeclampsia or eclampsia. For most women, symptoms of preeclampsia and eclampsia go away after giving birth. In rare cases, a woman may develop preeclampsia or eclampsia after giving birth. This usually occurs within 48 hours after childbirth but may occur up to 6 weeks after giving birth. What are the causes? The cause of this condition is not known. What increases the risk? The following factors make you more likely to develop preeclampsia:  Being pregnant for the first time or being pregnant with multiples.  Having had preeclampsia or a condition called hemolysis, elevated liver enzymes, and low platelet count (HELLP)syndrome during a past pregnancy.  Having a family history of preeclampsia.  Being older than age 35.  Being obese.  Becoming pregnant through fertility treatments. Conditions that reduce blood flow or oxygen to your placenta and baby may also increase your risk. These include:  High blood pressure before, during, or immediately following pregnancy.  Kidney disease.  Diabetes.  Blood clotting disorders.  Autoimmune diseases, such as lupus.  Sleep apnea. What are the signs or symptoms? Common symptoms of this condition include:  A severe, throbbing headache that does not go away.  Vision problems, such as blurred or double vision and light  sensitivity.  Pain in the stomach, especially the right upper region.  Pain in the shoulder. Other symptoms that may develop as the condition gets worse include:  Sudden weight gain because of fluid buildup in the body. This causes swelling of the face, hands, legs, and feet.  Severe nausea and vomiting.  Urinating less than usual.  Shortness of breath.  Seizures. How is this diagnosed? Your health care provider will ask you about symptoms and check for signs of preeclampsia during your prenatal visits. You will also have routine tests, including:  Checking your blood pressure.  Urine tests to check for protein.  Blood tests to assess your organ function.  Monitoring your baby's heart rate.  Ultrasounds to check fetal growth.   How is this treated? You and your health care provider will determine the treatment that is best for you. Treatment may include:  Frequent prenatal visits to check for preeclampsia.  Medicine to lower your blood pressure.  Medicine to prevent seizures.  Low-dose aspirin during your pregnancy.  Staying in the hospital, in severe cases. You will be given medicines to control your blood pressure and the amount of fluids in your body.  Delivering your baby. Work with your health care provider to manage any chronic health conditions, such as diabetes or kidney problems. Also, work with your health care provider to manage weight gain during pregnancy. Follow these instructions at home: Eating and drinking  Drink enough fluid to keep your urine pale yellow.  Avoid caffeine. Caffeine may increase blood pressure and heart rate and lead to dehydration.  Reduce the amount of salt that you eat. Lifestyle  Do not use any products that contain nicotine or tobacco. These products include cigarettes, chewing tobacco, and   vaping devices, such as e-cigarettes. If you need help quitting, ask your health care provider.  Do not use alcohol or drugs.  Avoid  stress as much as possible.  Rest and get plenty of sleep. General instructions  Take over-the-counter and prescription medicines only as told by your health care provider.  When lying down, lie on your left side. This keeps pressure off your major blood vessels.  When sitting or lying down, raise (elevate) your feet. Try putting pillows underneath your lower legs.  Exercise regularly. Ask your health care provider what kinds of exercise are best for you.  Check your blood pressure as often as recommended by your health care provider.  Keep all prenatal and follow-up visits. This is important.   Contact a health care provider if:  You have symptoms that may need treatment or closer monitoring. These include: ? Headaches. ? Stomach pain or nausea and vomiting. ? Shoulder pain. ? Vision problems, such as spots in front of your eyes or blurry vision. ? Sudden weight gain or increased swelling in your face, hands, legs, and feet. ? Increased anxiety or feeling of impending doom. ? Signs or symptoms of labor. Get help right away if:  You have any of the following symptoms: ? A seizure. ? Shortness of breath or trouble breathing. ? Trouble speaking or slurred speech. ? Fainting. ? Chest pain. These symptoms may represent a serious problem that is an emergency. Do not wait to see if the symptoms will go away. Get medical help right away. Call your local emergency services (911 in the [place] Do not drive yourself to the hospital. Summary  Preeclampsia is a serious condition that may develop during pregnancy.  Diagnosing and treating preeclampsia early is very important.  Keep all prenatal and follow-up visits. This is important.  Get help right away if you have a seizure, shortness of breath or trouble breathing, trouble speaking or slurred speech, chest pain, or fainting. This information is not intended to replace advice given to you by your health care provider. Make sure you  discuss any questions you have with your health care provider. Document Revised: 06/26/2020 Document Reviewed: 06/26/2020 Elsevier Patient Education  2021 [employer]  

## 2020-11-07 NOTE — ED Triage Notes (Signed)
Emergency Medicine Provider OB Triage Evaluation Note  Angela Taylor is a 30 y.o. female, G2P0010, at [redacted]w[redacted]d gestation who presents to the emergency department with complaints of hypertension. She checked her blood pressure at home and found it to be elevated in the 150s (systollically). She denies any symptoms at this time. She rechecked and it continued to be in the 140s-150s. She denies any headache, vision changes, CP, SOB, abdominal pain, vaginal bleeding, loss of fluid. She has still felt baby move.   Review of  Systems  Positive: Hypertension  Negative: vision changes, HA, abd pain, CP, SOB  Physical Exam  BP (!) 151/105   Pulse 83   Temp 98.5 F (36.9 C) (Oral)   Resp 18   LMP 03/19/2020   SpO2 100%  General: Awake, no distress  HEENT: Atraumatic  Resp: Normal effort  Cardiac: Normal rate WUJ:[id number] abd. No tenderness noted.  MSK: Moves all extremities without difficulty Neuro: Speech clear  Medical Decision Making  Pt evaluated for pregnancy concern and is stable for transfer to [facility]. Pt is in agreement with plan for transfer.  5:41 PM Discussed with MAU APP, Heather, who accepts patient in transfer.  Patient will be directed to the MAU lobby and they will see her.  I have ordered blood work, urine.  Her blood pressure here is 151/105.  At this time, she is stable and is asymptomatic.  She is feeling baby move.   Clinical Impression   1. Hypertension during pregnancy in third trimester, unspecified hypertension in pregnancy type     Portions of this note were generated with Scientist, clinical (histocompatibility and immunogenetics). Dictation errors may occur despite best attempts at proofreading.    Maxwell Caul, PA-C 11/07/20 1742

## 2020-11-08 ENCOUNTER — Inpatient Hospital Stay (HOSPITAL_COMMUNITY)
Admission: AD | Admit: 2020-11-08 | Discharge: 2020-11-08 | Disposition: A | Discharge status: Home / Self Care | Payer: BC Managed Care – PPO | Attending: Obstetrics and Gynecology | Admitting: Obstetrics and Gynecology

## 2020-11-08 ENCOUNTER — Other Ambulatory Visit: Payer: Self-pay

## 2020-11-08 ENCOUNTER — Encounter: Payer: Self-pay | Admitting: Obstetrics and Gynecology

## 2020-11-08 DIAGNOSIS — O26893 Other specified pregnancy related conditions, third trimester: Secondary | ICD-10-CM | POA: Diagnosis not present

## 2020-11-08 DIAGNOSIS — Z3A33 33 weeks gestation of pregnancy: Secondary | ICD-10-CM | POA: Diagnosis not present

## 2020-11-08 DIAGNOSIS — O139 Gestational [pregnancy-induced] hypertension without significant proteinuria, unspecified trimester: Secondary | ICD-10-CM | POA: Insufficient documentation

## 2020-11-08 MED ORDER — BETAMETHASONE SOD PHOS & ACET 6 (3-3) MG/ML IJ SUSP
12.0000 mg | Freq: Once | INTRAMUSCULAR | Status: AC
  Administered 2020-11-08: 12 mg via INTRAMUSCULAR

## 2020-11-08 NOTE — Discharge Instructions (Signed)
Hypertension During Pregnancy Hypertension is also called high blood pressure. High blood pressure means that the force of the blood moving in your body is high enough to cause problems for you and your baby. Different types of high blood pressure can happen during pregnancy. The types are:  High blood pressure before you got pregnant. This is called chronic hypertension.  This can continue during your pregnancy. Your doctor will want to keep checking your blood pressure. You may need medicine to control your blood pressure while you are pregnant. You will need follow-up visits after you have your baby.  High blood pressure that goes up during pregnancy when it was normal before. This is called gestational hypertension. It will often get better after you have your baby, but your doctor will need to watch your blood pressure to make sure that it is getting better.  You may develop high blood pressure after giving birth. This is called postpartum hypertension. This often occurs within 48 hours after childbirth but may occur up to 6 weeks after giving birth. Very high blood pressure during pregnancy is an emergency that needs treatment right away. How does this affect me? If you have high blood pressure during pregnancy, you have a higher chance of developing high blood pressure:  As you get older.  If you get pregnant again. In some cases, high blood pressure during pregnancy can cause:  Stroke.  Heart attack.  Damage to the kidneys, lungs, or liver.  Preeclampsia.  HELLP syndrome.  Seizures.  Problems with the placenta. How does this affect my baby? Your baby may:  Be born early.  Not weigh as much as he or she should.  Not handle labor well, leading to a C-section. This condition may also result in a baby's death before birth (stillbirth). What are the risks?  Having high blood pressure during a past pregnancy.  Being overweight.  Being age 35 or older.  Being pregnant  for the first time.  Being pregnant with more than one baby.  Becoming pregnant using fertility methods, such as IVF.  Having other problems, such as diabetes or kidney disease. What can I do to lower my risk?  Keep a healthy weight.  Eat a healthy diet.  Follow what your doctor tells you about treating any medical problems that you had before you got pregnant. It is very important to go to all of your doctor visits. Your doctor will check your blood pressure and make sure that your pregnancy is progressing as it should. Treatment should start early if a problem is found.   How is this treated? Treatment for high blood pressure during pregnancy can vary. It depends on the type of high blood pressure you have and how serious it is.  If you were taking medicine for your blood pressure before you got pregnant, talk with your doctor. You may need to change the medicine during pregnancy if it is not safe for your baby.  If your blood pressure goes up during pregnancy, your doctor may order medicine to treat this.  If you are at risk for preeclampsia, your doctor may tell you to take a low-dose aspirin while you are pregnant.  If you have very high blood pressure, you may need to stay in the hospital so you and your baby can be watched closely. You may also need to take medicine to lower your blood pressure.  In some cases, if your condition gets worse, you may need to have your baby early.   Follow these instructions at home: Eating and drinking  Drink enough fluid to keep your pee (urine) pale yellow.  Avoid caffeine.   Lifestyle  Do not smoke or use any products that contain nicotine or tobacco. If you need help quitting, ask your doctor.  Do not use alcohol or drugs.  Avoid stress.  Rest and get plenty of sleep.  Regular exercise can help. Ask your doctor what kinds of exercise are best for you. General instructions  Take over-the-counter and prescription medicines only as  told by your doctor.  Keep all prenatal and follow-up visits. Contact a doctor if:  You have symptoms that your doctor told you to watch for, such as: ? Headaches. ? A feeling like you may vomit (nausea). ? Vomiting. ? Belly (abdominal) pain. ? Feeling dizzy or light-headed. Get help right away if:  You have symptoms of serious problems, such as: ? Very bad belly pain that does not get better with treatment. ? A very bad headache that does not get better. ? Blurry vision. ? Double vision. ? Vomiting that does not get better. ? Sudden, fast weight gain. ? Sudden swelling in your hands, ankles, or face. ? Bleeding from your vagina. ? Blood in your pee. ? Shortness of breath. ? Chest pain. ? Weakness on one side of your body. ? Trouble talking.  Your baby is not moving as much as usual. These symptoms may be an emergency. Get help right away. Call your local emergency services (911 in the [place]  Do not wait to see if the symptoms will go away.  Do not drive yourself to the hospital. Summary  High blood pressure is also called hypertension.  High blood pressure means that the force of the blood moving in your body is high enough to cause problems for you and your baby.  Get help right away if you have symptoms of serious problems due to high blood pressure.  Keep all prenatal and follow-up visits. This information is not intended to replace advice given to you by your health care provider. Make sure you discuss any questions you have with your health care provider. Document Revised: 06/26/2020 Document Reviewed: 06/26/2020 Elsevier Patient Education  2021 [employer]  

## 2020-11-08 NOTE — [facility] Note (Signed)
Patient here for repeat BMZ injection.  Denies any complaints she needs to be seen for.  Denies LOF/VB/CTX.  Endorses + FM.

## 2020-11-13 ENCOUNTER — Other Ambulatory Visit: Payer: Self-pay

## 2020-11-13 ENCOUNTER — Ambulatory Visit: Payer: BC Managed Care – PPO | Admitting: *Deleted

## 2020-11-13 ENCOUNTER — Ambulatory Visit: Payer: BC Managed Care – PPO | Attending: Obstetrics and Gynecology

## 2020-11-13 ENCOUNTER — Encounter: Payer: Self-pay | Admitting: *Deleted

## 2020-11-13 DIAGNOSIS — O403XX0 Polyhydramnios, third trimester, not applicable or unspecified: Secondary | ICD-10-CM | POA: Diagnosis not present

## 2020-11-13 DIAGNOSIS — Z362 Encounter for other antenatal screening follow-up: Secondary | ICD-10-CM

## 2020-11-13 DIAGNOSIS — O133 Gestational [pregnancy-induced] hypertension without significant proteinuria, third trimester: Secondary | ICD-10-CM

## 2020-11-13 DIAGNOSIS — Z3480 Encounter for supervision of other normal pregnancy, unspecified trimester: Secondary | ICD-10-CM

## 2020-11-13 DIAGNOSIS — O98513 Other viral diseases complicating pregnancy, third trimester: Secondary | ICD-10-CM | POA: Diagnosis not present

## 2020-11-13 DIAGNOSIS — O365930 Maternal care for other known or suspected poor fetal growth, third trimester, not applicable or unspecified: Secondary | ICD-10-CM | POA: Insufficient documentation

## 2020-11-13 DIAGNOSIS — Z3A34 34 weeks gestation of pregnancy: Secondary | ICD-10-CM

## 2020-11-13 DIAGNOSIS — B009 Herpesviral infection, unspecified: Secondary | ICD-10-CM | POA: Diagnosis not present

## 2020-11-20 ENCOUNTER — Encounter: Payer: Self-pay | Admitting: Obstetrics & Gynecology

## 2020-11-20 ENCOUNTER — Ambulatory Visit: Payer: BC Managed Care – PPO | Attending: Obstetrics and Gynecology

## 2020-11-20 ENCOUNTER — Other Ambulatory Visit: Payer: Self-pay

## 2020-11-20 ENCOUNTER — Encounter (HOSPITAL_COMMUNITY): Payer: Self-pay | Admitting: Obstetrics and Gynecology

## 2020-11-20 ENCOUNTER — Ambulatory Visit (INDEPENDENT_AMBULATORY_CARE_PROVIDER_SITE_OTHER): Payer: BC Managed Care – PPO | Admitting: Obstetrics & Gynecology

## 2020-11-20 ENCOUNTER — Inpatient Hospital Stay (HOSPITAL_COMMUNITY)
Admission: AD | Admit: 2020-11-20 | Discharge: 2020-11-25 | DRG: 788 | Disposition: A | Discharge status: Home / Self Care | Payer: BC Managed Care – PPO | Attending: Obstetrics and Gynecology | Admitting: Obstetrics and Gynecology

## 2020-11-20 ENCOUNTER — Inpatient Hospital Stay (HOSPITAL_BASED_OUTPATIENT_CLINIC_OR_DEPARTMENT_OTHER): Payer: BC Managed Care – PPO

## 2020-11-20 ENCOUNTER — Ambulatory Visit: Payer: BC Managed Care – PPO

## 2020-11-20 VITALS — BP 153/101 | HR 83 | Wt 145.3 lb

## 2020-11-20 DIAGNOSIS — O365930 Maternal care for other known or suspected poor fetal growth, third trimester, not applicable or unspecified: Secondary | ICD-10-CM | POA: Diagnosis present

## 2020-11-20 DIAGNOSIS — Z3A35 35 weeks gestation of pregnancy: Secondary | ICD-10-CM

## 2020-11-20 DIAGNOSIS — O365990 Maternal care for other known or suspected poor fetal growth, unspecified trimester, not applicable or unspecified: Secondary | ICD-10-CM | POA: Diagnosis not present

## 2020-11-20 DIAGNOSIS — O403XX0 Polyhydramnios, third trimester, not applicable or unspecified: Secondary | ICD-10-CM | POA: Diagnosis present

## 2020-11-20 DIAGNOSIS — O365931 Maternal care for other known or suspected poor fetal growth, third trimester, fetus 1: Secondary | ICD-10-CM | POA: Diagnosis not present

## 2020-11-20 DIAGNOSIS — O1414 Severe pre-eclampsia complicating childbirth: Principal | ICD-10-CM | POA: Diagnosis present

## 2020-11-20 DIAGNOSIS — O133 Gestational [pregnancy-induced] hypertension without significant proteinuria, third trimester: Secondary | ICD-10-CM

## 2020-11-20 DIAGNOSIS — Z87891 Personal history of nicotine dependence: Secondary | ICD-10-CM | POA: Diagnosis not present

## 2020-11-20 DIAGNOSIS — O1413 Severe pre-eclampsia, third trimester: Secondary | ICD-10-CM

## 2020-11-20 DIAGNOSIS — Z3A00 Weeks of gestation of pregnancy not specified: Secondary | ICD-10-CM | POA: Diagnosis not present

## 2020-11-20 DIAGNOSIS — O411230 Chorioamnionitis, third trimester, not applicable or unspecified: Secondary | ICD-10-CM | POA: Diagnosis not present

## 2020-11-20 DIAGNOSIS — Z880 Allergy status to penicillin: Secondary | ICD-10-CM

## 2020-11-20 DIAGNOSIS — O0993 Supervision of high risk pregnancy, unspecified, third trimester: Secondary | ICD-10-CM

## 2020-11-20 DIAGNOSIS — Z20822 Contact with and (suspected) exposure to covid-19: Secondary | ICD-10-CM | POA: Diagnosis present

## 2020-11-20 DIAGNOSIS — O409XX0 Polyhydramnios, unspecified trimester, not applicable or unspecified: Secondary | ICD-10-CM

## 2020-11-20 LAB — TYPE AND SCREEN
ABO/RH(D): B POS
Antibody Screen: NEGATIVE

## 2020-11-20 LAB — URINALYSIS, ROUTINE W REFLEX MICROSCOPIC
Bilirubin Urine: NEGATIVE
Glucose, UA: NEGATIVE mg/dL
Ketones, ur: NEGATIVE mg/dL
Leukocytes,Ua: NEGATIVE
Nitrite: NEGATIVE
Protein, ur: NEGATIVE mg/dL
Specific Gravity, Urine: 1.004 — ABNORMAL LOW (ref 1.005–1.030)
pH: 6 (ref 5.0–8.0)

## 2020-11-20 LAB — CBC
HCT: 40.4 % (ref 36.0–46.0)
Hemoglobin: 13.3 g/dL (ref 12.0–15.0)
MCH: 32.1 pg (ref 26.0–34.0)
MCHC: 32.9 g/dL (ref 30.0–36.0)
MCV: 97.6 fL (ref 80.0–100.0)
Platelets: 276 10*3/uL (ref 150–400)
RBC: 4.14 MIL/uL (ref 3.87–5.11)
RDW: 12.7 % (ref 11.5–15.5)
WBC: 11 10*3/uL — ABNORMAL HIGH (ref 4.0–10.5)
nRBC: 0 % (ref 0.0–0.2)

## 2020-11-20 LAB — COMPREHENSIVE METABOLIC PANEL
ALT: 16 U/L (ref 0–44)
AST: 16 U/L (ref 15–41)
Albumin: 2.8 g/dL — ABNORMAL LOW (ref 3.5–5.0)
Alkaline Phosphatase: 171 U/L — ABNORMAL HIGH (ref 38–126)
Anion gap: 10 (ref 5–15)
BUN: 9 mg/dL (ref 6–20)
CO2: 22 mmol/L (ref 22–32)
Calcium: 9.5 mg/dL (ref 8.9–10.3)
Chloride: 104 mmol/L (ref 98–111)
Creatinine, Ser: 0.5 mg/dL (ref 0.44–1.00)
GFR, Estimated: >60 mL/min (ref >60)
Glucose, Bld: 71 mg/dL (ref 70–99)
Potassium: 3.8 mmol/L (ref 3.5–5.1)
Sodium: 136 mmol/L (ref 135–145)
Total Bilirubin: 0.4 mg/dL (ref 0.3–1.2)
Total Protein: 6.6 g/dL (ref 6.5–8.1)

## 2020-11-20 LAB — SARS CORONAVIRUS 2 BY RT PCR (HOSPITAL ORDER, PERFORMED IN ~~LOC~~ HOSPITAL LAB): SARS Coronavirus 2: NEGATIVE

## 2020-11-20 LAB — PROTEIN / CREATININE RATIO, URINE
Creatinine, Urine: 21.36 mg/dL
Protein Creatinine Ratio: 0.33 mg/mg{Cre} — ABNORMAL HIGH (ref 0.00–0.15)
Total Protein, Urine: 7 mg/dL

## 2020-11-20 MED ORDER — OXYCODONE-ACETAMINOPHEN 5-325 MG PO TABS: 2.0000 | ORAL_TABLET | ORAL | Status: DC | PRN

## 2020-11-20 MED ORDER — ONDANSETRON HCL 4 MG/2ML IJ SOLN: 4.0000 mg | Freq: Four times a day (QID) | INTRAMUSCULAR | Status: DC | PRN

## 2020-11-20 MED ORDER — OXYCODONE HCL 5 MG PO TABS: 5.0000 mg | ORAL_TABLET | Freq: Four times a day (QID) | ORAL | Status: DC | PRN

## 2020-11-20 MED ORDER — MISOPROSTOL 50MCG HALF TABLET
50.0000 ug | ORAL_TABLET | ORAL | Status: DC
  Administered 2020-11-21 (×3): 50 ug via BUCCAL
  Filled 2020-11-20 (×3): qty 1

## 2020-11-20 MED ORDER — TERBUTALINE SULFATE 1 MG/ML IJ SOLN
0.2500 mg | Freq: Once | INTRAMUSCULAR | Status: DC | PRN
Start: 2020-11-20 — End: 2020-11-22

## 2020-11-20 MED ORDER — LIDOCAINE HCL (PF) 1 % IJ SOLN: 30.0000 mL | INTRAMUSCULAR | Status: DC | PRN

## 2020-11-20 MED ORDER — CEFAZOLIN SODIUM-DEXTROSE 2-4 GM/100ML-% IV SOLN
2.0000 g | Freq: Once | INTRAVENOUS | Status: AC
  Administered 2020-11-21: 2 g via INTRAVENOUS
  Filled 2020-11-20: qty 100

## 2020-11-20 MED ORDER — CEFAZOLIN SODIUM-DEXTROSE 1-4 GM/50ML-% IV SOLN
1.0000 g | Freq: Three times a day (TID) | INTRAVENOUS | Status: DC
Start: 2020-11-21 — End: 2020-11-22
  Administered 2020-11-21 – 2020-11-22 (×3): 1 g via INTRAVENOUS
  Filled 2020-11-20 (×5): qty 50

## 2020-11-20 MED ORDER — BETAMETHASONE SOD PHOS & ACET 6 (3-3) MG/ML IJ SUSP
12.0000 mg | INTRAMUSCULAR | Status: AC
  Administered 2020-11-21 – 2020-11-22 (×2): 12 mg via INTRAMUSCULAR
  Filled 2020-11-20 (×2): qty 5

## 2020-11-20 MED ORDER — LABETALOL HCL 5 MG/ML IV SOLN: 40.0000 mg | INTRAVENOUS | Status: DC | PRN

## 2020-11-20 MED ORDER — OXYCODONE-ACETAMINOPHEN 5-325 MG PO TABS: 1.0000 | ORAL_TABLET | ORAL | Status: DC | PRN

## 2020-11-20 MED ORDER — OXYCODONE HCL 5 MG PO TABS
10.0000 mg | ORAL_TABLET | Freq: Once | ORAL | Status: AC
  Administered 2020-11-20: 10 mg via ORAL
  Filled 2020-11-20: qty 2

## 2020-11-20 MED ORDER — HYDRALAZINE HCL 20 MG/ML IJ SOLN: 10.0000 mg | INTRAMUSCULAR | Status: DC | PRN

## 2020-11-20 MED ORDER — SOD CITRATE-CITRIC ACID 500-334 MG/5ML PO SOLN
30.0000 mL | ORAL | Status: DC | PRN
  Administered 2020-11-22: 30 mL via ORAL
  Filled 2020-11-20: qty 15

## 2020-11-20 MED ORDER — MISOPROSTOL 50MCG HALF TABLET
ORAL_TABLET | ORAL | Status: AC
  Administered 2020-11-20: 50 ug via BUCCAL
  Filled 2020-11-20: qty 1

## 2020-11-20 MED ORDER — LABETALOL HCL 5 MG/ML IV SOLN: 80.0000 mg | INTRAVENOUS | Status: DC | PRN

## 2020-11-20 MED ORDER — OXYTOCIN BOLUS FROM INFUSION: 333.0000 mL | Freq: Once | INTRAVENOUS | Status: DC

## 2020-11-20 MED ORDER — LABETALOL HCL 5 MG/ML IV SOLN
20.0000 mg | INTRAVENOUS | Status: DC | PRN
  Administered 2020-11-20: 15:00:00 20 mg via INTRAVENOUS
  Filled 2020-11-20 (×2): qty 4

## 2020-11-20 MED ORDER — MAGNESIUM SULFATE 40 GM/1000ML IV SOLN
2.0000 g/h | INTRAVENOUS | Status: DC
  Administered 2020-11-20 – 2020-11-21 (×2): 2 g/h via INTRAVENOUS
  Filled 2020-11-20 (×3): qty 1000

## 2020-11-20 MED ORDER — BUTALBITAL-APAP-CAFFEINE 50-325-40 MG PO TABS
2.0000 | ORAL_TABLET | Freq: Four times a day (QID) | ORAL | Status: DC | PRN
  Administered 2020-11-20: 2 via ORAL
  Filled 2020-11-20: qty 2

## 2020-11-20 MED ORDER — LACTATED RINGERS IV SOLN: Freq: Once | INTRAVENOUS | Status: AC

## 2020-11-20 MED ORDER — ACETAMINOPHEN 325 MG PO TABS: 650.0000 mg | ORAL_TABLET | ORAL | Status: DC | PRN

## 2020-11-20 MED ORDER — LABETALOL HCL 5 MG/ML IV SOLN: 20.0000 mg | INTRAVENOUS | Status: DC | PRN

## 2020-11-20 MED ORDER — LACTATED RINGERS IV SOLN: INTRAVENOUS | Status: DC

## 2020-11-20 MED ORDER — MAGNESIUM SULFATE BOLUS VIA INFUSION
4.0000 g | Freq: Once | INTRAVENOUS | Status: AC
  Administered 2020-11-20: 4 g via INTRAVENOUS
  Filled 2020-11-20: qty 1000

## 2020-11-20 MED ORDER — ZOLPIDEM TARTRATE 5 MG PO TABS: 5.0000 mg | ORAL_TABLET | Freq: Every evening | ORAL | Status: DC | PRN

## 2020-11-20 MED ORDER — OXYTOCIN-SODIUM CHLORIDE 30-0.9 UT/500ML-% IV SOLN
2.5000 [IU]/h | INTRAVENOUS | Status: DC
  Filled 2020-11-20: qty 500

## 2020-11-20 MED ORDER — LABETALOL HCL 5 MG/ML IV SOLN
40.0000 mg | INTRAVENOUS | Status: DC | PRN
  Administered 2020-11-20: 40 mg via INTRAVENOUS
  Filled 2020-11-20: qty 8

## 2020-11-20 MED ORDER — LACTATED RINGERS IV SOLN
500.0000 mL | INTRAVENOUS | Status: DC | PRN
Start: 2020-11-20 — End: 2020-11-22
  Administered 2020-11-22: 250 mL via INTRAVENOUS
  Administered 2020-11-22: 500 mL via INTRAVENOUS
  Administered 2020-11-22: 250 mL via INTRAVENOUS
  Administered 2020-11-22: 500 mL via INTRAVENOUS

## 2020-11-20 NOTE — Progress Notes (Signed)
PRENATAL VISIT NOTE  Subjective:  Angela Taylor is a 30 y.o. G2P0010 at 6855w1d being seen today for ongoing prenatal care.  She is currently monitored for the following issues for this high-risk pregnancy and has Supervision of high-risk pregnancy; IUGR (intrauterine growth restriction) affecting care of mother; and Gestational hypertension on their problem list.  Patient reports neck pain and left inguinal pain. Showering helps with pain. Also elevated BP at home,  elevated with movement and lower with rest. Patient denies any headaches, visual symptoms, RUQ/epigastric pain or other concerning symptoms.  Contractions: Irregular. Vag. Bleeding: None.  Movement: Present. Denies leaking of fluid.   The following portions of the patient's history were reviewed and updated as appropriate: allergies, current medications, past family history, past medical history, past social history, past surgical history and problem list.   Objective:   Vitals:   11/20/20 1110 11/20/20 1113  BP: (!) 154/106 (!) 153/101  Pulse: 83   Weight: 145 lb 4.8 oz (65.9 kg)     Fetal Status: Fetal Heart Rate (bpm): 151   Movement: Present     General:  Alert, oriented and cooperative. Patient is in no acute distress.  Skin: Skin is warm and dry. No rash noted.   Cardiovascular: Normal heart rate noted  Respiratory: Normal respiratory effort, no problems with respiration noted  Abdomen: Soft, gravid, appropriate for gestational age.  Pain/Pressure: Absent     Pelvic: Cervical exam deferred        Extremities: Normal range of motion.  Edema: None  Mental Status: Normal mood and affect. Normal behavior. Normal judgment and thought content.   Imaging: US MFM FETAL BPP WO NON STRESS  Result Date: 11/13/2020 ----------------------------------------------------------------------  OBSTETRICS REPORT                       (Signed Final 11/13/2020 05:52 pm)  ---------------------------------------------------------------------- Patient Info  ID #:       [id number]                          D.O.B.:  05/08/1991 (29 yrs)  Name:       Angela Taylor                  Visit Date: 11/13/2020 03:49 pm ---------------------------------------------------------------------- Performed By  Attending:        Noralee Spaceavi Shankar MD        Ref. Address:     [address]                                                             [address]                                                             [place], [place]  [address]  Performed By:     Eden Lathe BS      [facility]                                 [facility]  Referred By:      Brock Bad MD ---------------------------------------------------------------------- Orders  #  Description                           Code        Ordered By  1  [place] MFM FETAL BPP WO NON               76819.01    YU FANG     STRESS  2  [place] MFM OB FOLLOW UP                   76816.01    YU FANG  3  [place] MFM UA CORD DOPPLER                76820.02    YU FANG ----------------------------------------------------------------------  #  Order #                     Accession #                Episode #  1  [id number]                   [id number]                 [id number]  2  [id number]                   [id number]                 [id number]  3  [id number]                   [id number]                 [id number] ---------------------------------------------------------------------- Indications  [redacted] weeks gestation of pregnancy                Z3A.34  Maternal care for known or suspected poor      O36.5930  fetal growth, third trimester, not applicable or  unspecified IUGR  Herpes simplex virus (HSV)  O98.519 B00.9  Negative AFP(Negative INVITAE)  Encounter for other antenatal screening        Z36.2  follow-up  Polyhydramnios, third trimester, antepartum    O40.3XX0  condition or complication, unspecified fetus ---------------------------------------------------------------------- Fetal Evaluation  Num Of Fetuses:         1  Fetal Heart Rate(bpm):  157  Cardiac Activity:       Observed  Presentation:           Cephalic  Placenta:               Posterior  P. Cord Insertion:      Previously Visualized  Amniotic Fluid  AFI FV:      Polyhydramnios  AFI Sum(cm)     %Tile       Largest Pocket(cm)  29.6            > 97        9  RUQ(cm)       RLQ(cm)       LUQ(cm)        LLQ(cm)  7.8           6.8           6              9  ---------------------------------------------------------------------- Biophysical Evaluation  Amniotic F.V:   Pocket => 2 cm             F. Tone:        Observed  F. Movement:    Observed                   Score:          8/8  F. Breathing:   Observed ---------------------------------------------------------------------- Biometry  BPD:      82.9  mm     G. Age:  33w 2d         25  %    CI:        72.37   %    70 - 86                                                          FL/HC:      18.0   %    19.4 - 21.8  HC:       310   mm     G. Age:  34w 4d         26  %    HC/AC:      1.06        0.96 - 1.11  AC:      293.1  mm     G. Age:  33w 2d         29  %    FL/BPD:     67.2   %    71 - 87  FL:       55.7  mm     G. Age:  29w 2d        < 1  %    FL/AC:      19.0   %    20 - 24  HUM:      49.9  mm     G. Age:  29w 2d        <  5  %  Est. FW:    1941  gm      4 lb 4 oz      6  % ---------------------------------------------------------------------- OB History  Gravidity:    2  Living:       0 ---------------------------------------------------------------------- Gestational Age  LMP:           34w 1d        Date:  03/19/20                 EDD:   12/24/20  U/S Today:     32w 4d                                         EDD:   01/04/21  Best:          34w 1d     Det. By:  LMP  (03/19/20)          EDD:   12/24/20 ---------------------------------------------------------------------- Anatomy  Cranium:               Appears normal         LVOT:                   Appears normal  Cavum:                 Previously seen        Aortic Arch:            Previously seen  Ventricles:            Previously seen        Ductal Arch:            Previously seen  Choroid Plexus:        Appears normal         Diaphragm:              Appears normal  Cerebellum:            Previously seen        Stomach:                Appears normal, left                                                                        sided  Posterior Fossa:       Previously seen        Abdomen:                Hyperchogenic                                                                        bowel  Nuchal Fold:           Previously seen        Abdominal Wall:         Previously  seen  Face:                  Appears normal         Cord Vessels:           Previously seen                         (orbits and profile)  Lips:                  Previously seen        Kidneys:                Appear normal  Palate:                Previously seen        Bladder:                Appears normal  Thoracic:              Previously seen        Spine:                  Previously seen  Heart:                 Previously seen        Upper Extremities:      Previously seen  RVOT:                  Appears normal         Lower Extremities:      Previously seen  Other:  Heels, open hands visualized previously. Female gender previously          seen. ---------------------------------------------------------------------- Doppler - Fetal Vessels  Umbilical Artery   S/D     %tile      RI    %tile      PI    %tile            ADFV    RDFV   2.46       46    0.59       51     0.9       58               No      No ----------------------------------------------------------------------  Impression  Fetal growth restriction.  Patient return for fetal growth  assessment and antenatal testing.  She was evaluated at the MAU for hypertension on  11/07/2020.  Patient received antenatal corticosteroids.  Labs  including liver enzymes and platelets were within normal  range.  Patient does not have gestational diabetes.  Blood pressures today at our office were 155/102 and  138/100 mmHg.  On ultrasound, severe polyhydramnios is seen.  (AFI 30 cm).  Hyperechogenic bowel is seen (consistent with fetal growth  restriction).  Fetal stomach is present.  Both kidneys appear  normal.  Good fetal activity is present.  The estimated fetal  weight is at the 6 percentile.  Interval weight gain is 592 g  over 3 weeks.  Antenatal testing is reassuring. BPP 8/8.  Nuchal artery  Doppler showed normal forward diastolic flow.  I explained the diagnosis of gestational hypertension that is  associated with increased to maternal or fetal complications.  She also has fetal growth restriction and polyhydramnios.  All  these increase the risk of adverse fetal or neonatal outcomes.  I discussed the options:  a)  Inpatient management with daily NST and twice weekly  fetal testing (recommended).  Patient does not want to be  admitted now.  b) MAU evaluation today for series of blood pressure  monitoring.  Patient reports she has blood pressure cuff at  home and we discussed the parameters again.  She does not  want to go to the MAU.  c) Twice weekly BPP, NST and UA Doppler at our office,  which I consider an inferior option.  Patient would like to discuss with Dr. Grace Bushy and decide.  I  told her Dr. Grace Bushy will be calling her tomorrow.  She was made aware that gestational hypertension is  associated with increased maternal complications. ---------------------------------------------------------------------- Recommendations  Patient has an appointment to return for antenatal testing on  11/20/2020.  ----------------------------------------------------------------------                  Noralee Space, MD Electronically Signed Final Report   11/13/2020 05:52 pm ----------------------------------------------------------------------  [place] MFM FETAL BPP WO NON STRESS  Result Date: 11/06/2020 ----------------------------------------------------------------------  OBSTETRICS REPORT                       (Signed Final 11/06/2020 07:17 pm) ---------------------------------------------------------------------- Patient Info  ID #:       [id number]                          D.O.B.:  Apr 14, 1991 (29 yrs)  Name:       Angela Taylor                  Visit Date: 11/06/2020 04:33 pm ---------------------------------------------------------------------- Performed By  Attending:        Noralee Space MD        Ref. Address:     [address]                                                             [place], [place]                                                             [address]  Performed By:     Eden Lathe BS      [facility]                    RD[facility] RVT                                 [facility]  Referred By:      Brock Bad MD ---------------------------------------------------------------------- Orders  #  Description                           Code        Ordered By  1  [place] MFM FETAL BPP WO NON               76819.01    YU FANG     STRESS  2  [place] MFM UA CORD DOPPLER                76820.02    YU FANG ----------------------------------------------------------------------  #  Order #                     Accession #                Episode #  1  [id number]                   [id number]                 [id number]  2  [id number]                   [id number]                  [id number] ---------------------------------------------------------------------- Indications  Maternal care for known or suspected poor      O36.5930  fetal growth, third trimester, not applicable or  unspecified IUGR  Herpes simplex virus (HSV)                     O98.519 B00.9  Negative AFP(Negative INVITAE)  Encounter for other antenatal screening        Z36.2  follow-up  [redacted] weeks gestation of pregnancy                Z3A.33  Polyhydramnios, third trimester, antepartum    O40.3XX0  condition or complication, unspecified fetus ---------------------------------------------------------------------- Fetal Evaluation  Num Of Fetuses:         1  Fetal Heart Rate(bpm):  144  Cardiac Activity:       Observed  Presentation:           Cephalic  Amniotic Fluid  AFI FV:      Polyhydramnios  AFI Sum(cm)     %Tile       Largest Pocket(cm)  30.8            > 97        8.6  RUQ(cm)       RLQ(cm)       LUQ(cm)        LLQ(cm)  8.6           6.8           7.3            8.1 ---------------------------------------------------------------------- Biophysical Evaluation  Amniotic F.V:  Within normal limits       F. Tone:        Observed  F. Movement:    Observed                   Score:          8/8  F. Breathing:   Observed ---------------------------------------------------------------------- OB History  Gravidity:    2  Living:       0 ---------------------------------------------------------------------- Gestational Age  LMP:           33w 1d        Date:  03/19/20                 EDD:   12/24/20  Best:          33w 1d     Det. By:  LMP  (03/19/20)          EDD:   12/24/20 ---------------------------------------------------------------------- Doppler - Fetal Vessels  Umbilical Artery   S/D     %tile      RI    %tile                             ADFV    RDFV   2.36       34    0.58       41                                No      No ---------------------------------------------------------------------- Impression  Fetal growth  restriction.  On previous ultrasound, the  estimated fetal weight was at the 5th percentile.  Patient  returned for antenatal testing  On today's ultrasound, severe polyhydramnios is seen.  Antenatal testing is reassuring.  BPP 8/8.  Umbilical artery  Doppler showed normal forward diastolic flow.  Cephalic  presentation.  Patient does not have gestational diabetes.  I explained the  findings that polyhydramnios is usually idiopathic (no known  cause).  It can lead to preterm labor. ---------------------------------------------------------------------- Recommendations  Fetal growth assessment and antenatal testing next week. ----------------------------------------------------------------------                  Noralee Space, MD Electronically Signed Final Report   11/06/2020 07:17 pm ----------------------------------------------------------------------  [place] MFM FETAL BPP WO NON STRESS  Result Date: 10/30/2020 ----------------------------------------------------------------------  OBSTETRICS REPORT                       (Signed Final 10/30/2020 05:18 pm) ---------------------------------------------------------------------- Patient Info  ID #:       [id number]                          D.O.B.:  Jul 19, 1991 (29 yrs)  Name:       Angela Taylor                  Visit Date: 10/30/2020 03:15 pm ---------------------------------------------------------------------- Performed By  Attending:        Lin Landsman      Ref. Address:     [address]                                                             [place], [place]                                                             [address]  Performed By:     Tommie Raymond BS,       Location:         [facility]                    RDMS, RVT                                [facility]  Referred By:      Brock Bad MD ---------------------------------------------------------------------- Orders  #  Description                           Code        Ordered By  1  [place] MFM UA CORD DOPPLER                76820.02    RAVI SHANKAR  2  [place] MFM FETAL BPP WO NON               76819.01    YU FANG     STRESS ----------------------------------------------------------------------  #  Order #                     Accession #                Episode #  1  [id number]                   [id number]                 [id number]  2  [id number]                   [id number]                 [id number] ---------------------------------------------------------------------- Indications  [redacted] weeks gestation  of pregnancy                Z3A.32  Maternal care for known or suspected poor      O36.5931  fetal growth, third trimester, fetus 1 IUGR  Herpes simplex virus (HSV)                     O98.519 B00.9  Negative AFP(Negative INVITAE)  Encounter for other antenatal screening        Z36.2  follow-up ---------------------------------------------------------------------- Fetal Evaluation  Num Of Fetuses:         1  Fetal Heart Rate(bpm):  143  Cardiac Activity:       Observed  Presentation:           Cephalic  Placenta:               Posterior  P. Cord Insertion:      Previously Visualized  Amniotic Fluid  AFI FV:      Subjectively upper-normal  AFI Sum(cm)     %Tile       Largest Pocket(cm)  26              96          8.9  RUQ(cm)       RLQ(cm)       LUQ(cm)        LLQ(cm)  8.9           5.4           6.3            5.4 ---------------------------------------------------------------------- Biophysical Evaluation  Amniotic F.V:   Pocket => 2 cm             F. Tone:        Observed  F. Movement:    Observed                   Score:          8/8  F. Breathing:   Observed ---------------------------------------------------------------------- Biometry  LV:          6   mm ---------------------------------------------------------------------- OB History  Gravidity:    2  Living:       0 ---------------------------------------------------------------------- Gestational Age  LMP:           32w 1d        Date:  03/19/20                 EDD:   12/24/20  Best:          32w 1d     Det. By:  LMP  (03/19/20)          EDD:   12/24/20 ---------------------------------------------------------------------- Anatomy  Cranium:               Previously seen        LVOT:                   Previously seen  Cavum:                 Previously seen        Aortic Arch:            Previously seen  Ventricles:            Appears normal         Ductal Arch:            Previously seen  Choroid Plexus:  Previously seen        Diaphragm:              Appears normal  Cerebellum:            Previously seen        Stomach:                Appears normal, left                                                                        sided  Posterior Fossa:       Previously seen        Abdomen:                Previously seen  Nuchal Fold:           Previously seen        Abdominal Wall:         Previously seen  Face:                  Orbits and profile     Cord Vessels:           Previously seen                         previously seen  Lips:                  Previously seen        Kidneys:                Appear normal  Palate:                Previously seen        Bladder:                Appears normal  Thoracic:              Previously seen        Spine:                  Previously seen  Heart:                 Appears normal         Upper Extremities:      Previously seen  RVOT:                  Previously seen        Lower Extremities:      Previously seen  Other:  Heels, open hands visualized previously. Female gender previously          seen. ---------------------------------------------------------------------- Doppler - Fetal Vessels  Umbilical Artery   S/D     %tile      RI    %tile                              ADFV    RDFV   2.33       28    0.57       31  No      No ---------------------------------------------------------------------- Cervix Uterus Adnexa  Cervix  Not visualized (advanced GA >24wks)  Uterus  No abnormality visualized.  Right Ovary  Within normal limits.  Left Ovary  Within normal limits.  Cul De Sac  No free fluid seen.  Adnexa  No abnormality visualized. ---------------------------------------------------------------------- Impression  Antenatal testing due to IUGR with an EFW 5th% with an AC  < 22th%.  Biophysical profile 8/8 with good fetal movement and  amniotic fluid volume  UA Dopplers are normal with no evidence of AEDF or REDF ---------------------------------------------------------------------- Recommendations  Continue weekly testing with UA Dopplers.  Repeat growth in 3-4 weeks.  Determine delivery at next growth. ----------------------------------------------------------------------               Lin Landsman, MD Electronically Signed Final Report   10/30/2020 05:18 pm ----------------------------------------------------------------------  [place] MFM OB FOLLOW UP  Result Date: 11/13/2020 ----------------------------------------------------------------------  OBSTETRICS REPORT                       (Signed Final 11/13/2020 05:52 pm) ---------------------------------------------------------------------- Patient Info  ID #:       [id number]                          D.O.B.:  1990/11/12 (29 yrs)  Name:       Angela Taylor                  Visit Date: 11/13/2020 03:49 pm ---------------------------------------------------------------------- Performed By  Attending:        Noralee Space MD        Ref. Address:     [address]                                                             [place], [place]                                                             [address]  Performed By:      Eden Lathe BS      [facility]                    RD[facility] RVT                                 [facility]  Referred By:      Brock Bad MD ---------------------------------------------------------------------- Orders  #  Description                           Code        Ordered By  1  [place] MFM FETAL BPP WO NON               76819.01    YU FANG     STRESS  2  [place] MFM OB FOLLOW UP                   76816.01    YU FANG  3  [place] MFM UA CORD DOPPLER                76820.02    YU FANG ----------------------------------------------------------------------  #  Order #                     Accession #                Episode #  1  [id number]                   [id number]                 [id number]  2  [id number]                   [id number]                 [id number]  3  [id number]                   [id number]                 [id number] ---------------------------------------------------------------------- Indications  [redacted] weeks gestation of pregnancy                Z3A.34  Maternal care for known or suspected poor      O36.5930  fetal growth, third trimester, not applicable or  unspecified IUGR  Herpes simplex virus (HSV)                     O98.519 B00.9  Negative AFP(Negative INVITAE)  Encounter for other antenatal screening        Z36.2  follow-up  Polyhydramnios, third trimester, antepartum    O40.3XX0  condition or complication, unspecified fetus ---------------------------------------------------------------------- Fetal Evaluation  Num Of Fetuses:         1  Fetal Heart Rate(bpm):  157  Cardiac Activity:       Observed  Presentation:           Cephalic  Placenta:               Posterior  P. Cord Insertion:      Previously Visualized  Amniotic Fluid  AFI FV:      Polyhydramnios  AFI Sum(cm)     %Tile  Largest Pocket(cm)  29.6             > 97        9  RUQ(cm)       RLQ(cm)       LUQ(cm)        LLQ(cm)  7.8           6.8           6              9  ---------------------------------------------------------------------- Biophysical Evaluation  Amniotic F.V:   Pocket => 2 cm             F. Tone:        Observed  F. Movement:    Observed                   Score:          8/8  F. Breathing:   Observed ---------------------------------------------------------------------- Biometry  BPD:      82.9  mm     G. Age:  33w 2d         25  %    CI:        72.37   %    70 - 86                                                          FL/HC:      18.0   %    19.4 - 21.8  HC:       310   mm     G. Age:  34w 4d         26  %    HC/AC:      1.06        0.96 - 1.11  AC:      293.1  mm     G. Age:  33w 2d         29  %    FL/BPD:     67.2   %    71 - 87  FL:       55.7  mm     G. Age:  29w 2d        < 1  %    FL/AC:      19.0   %    20 - 24  HUM:      49.9  mm     G. Age:  29w 2d        < 5  %  Est. FW:    1941  gm      4 lb 4 oz      6  % ---------------------------------------------------------------------- OB History  Gravidity:    2  Living:       0 ---------------------------------------------------------------------- Gestational Age  LMP:           34w 1d        Date:  03/19/20                 EDD:   12/24/20  U/S Today:     32w 4d  EDD:   01/04/21  Best:          34w 1d     Det. By:  LMP  (03/19/20)          EDD:   12/24/20 ---------------------------------------------------------------------- Anatomy  Cranium:               Appears normal         LVOT:                   Appears normal  Cavum:                 Previously seen        Aortic Arch:            Previously seen  Ventricles:            Previously seen        Ductal Arch:            Previously seen  Choroid Plexus:        Appears normal         Diaphragm:              Appears normal  Cerebellum:            Previously seen        Stomach:                Appears normal,  left                                                                        sided  Posterior Fossa:       Previously seen        Abdomen:                Hyperchogenic                                                                        bowel  Nuchal Fold:           Previously seen        Abdominal Wall:         Previously seen  Face:                  Appears normal         Cord Vessels:           Previously seen                         (orbits and profile)  Lips:                  Previously seen        Kidneys:                Appear normal  Palate:                Previously seen  Bladder:                Appears normal  Thoracic:              Previously seen        Spine:                  Previously seen  Heart:                 Previously seen        Upper Extremities:      Previously seen  RVOT:                  Appears normal         Lower Extremities:      Previously seen  Other:  Heels, open hands visualized previously. Female gender previously          seen. ---------------------------------------------------------------------- Doppler - Fetal Vessels  Umbilical Artery   S/D     %tile      RI    %tile      PI    %tile            ADFV    RDFV   2.46       46    0.59       51     0.9       58               No      No ---------------------------------------------------------------------- Impression  Fetal growth restriction.  Patient return for fetal growth  assessment and antenatal testing.  She was evaluated at the MAU for hypertension on  11/07/2020.  Patient received antenatal corticosteroids.  Labs  including liver enzymes and platelets were within normal  range.  Patient does not have gestational diabetes.  Blood pressures today at our office were 155/102 and  138/100 mmHg.  On ultrasound, severe polyhydramnios is seen.  (AFI 30 cm).  Hyperechogenic bowel is seen (consistent with fetal growth  restriction).  Fetal stomach is present.  Both kidneys appear  normal.  Good fetal activity is present.  The  estimated fetal  weight is at the 6 percentile.  Interval weight gain is 592 g  over 3 weeks.  Antenatal testing is reassuring. BPP 8/8.  Nuchal artery  Doppler showed normal forward diastolic flow.  I explained the diagnosis of gestational hypertension that is  associated with increased to maternal or fetal complications.  She also has fetal growth restriction and polyhydramnios.  All  these increase the risk of adverse fetal or neonatal outcomes.  I discussed the options:  a) Inpatient management with daily NST and twice weekly  fetal testing (recommended).  Patient does not want to be  admitted now.  b) MAU evaluation today for series of blood pressure  monitoring.  Patient reports she has blood pressure cuff at  home and we discussed the parameters again.  She does not  want to go to the MAU.  c) Twice weekly BPP, NST and UA Doppler at our office,  which I consider an inferior option.  Patient would like to discuss with Dr. Grace Bushy and decide.  I  told her Dr. Grace Bushy will be calling her tomorrow.  She was made aware that gestational hypertension is  associated with increased maternal complications. ---------------------------------------------------------------------- Recommendations  Patient has an appointment to return for antenatal testing on  11/20/2020. ----------------------------------------------------------------------  Noralee Space, MD Electronically Signed Final Report   11/13/2020 05:52 pm ----------------------------------------------------------------------  [place] MFM UA CORD DOPPLER  Result Date: 11/13/2020 ----------------------------------------------------------------------  OBSTETRICS REPORT                       (Signed Final 11/13/2020 05:52 pm) ---------------------------------------------------------------------- Patient Info  ID #:       [id number]                          D.O.B.:  1990/11/16 (29 yrs)  Name:       Angela Taylor                  Visit Date: 11/13/2020 03:49  pm ---------------------------------------------------------------------- Performed By  Attending:        Noralee Space MD        Ref. Address:     [address]                                                             [place], [place]                                                             [address]  Performed By:     Eden Lathe BS      [facility]                                 [facility]  Referred By:      Brock Bad MD ---------------------------------------------------------------------- Orders  #  Description  Code        Ordered By  1  [place] MFM FETAL BPP WO NON               [id number]    YU FANG     STRESS  2  [place] MFM OB FOLLOW UP                   76816.01    YU FANG  3  [place] MFM UA CORD DOPPLER                76820.02    YU FANG ----------------------------------------------------------------------  #  Order #                     Accession #                Episode #  1  [id number]                   [id number]                 [id number]  2  [id number]                   [id number]                 [id number]  3  [id number]                   [id number]                 [id number] ---------------------------------------------------------------------- Indications  [redacted] weeks gestation of pregnancy                Z3A.34  Maternal care for known or suspected poor      O36.5930  fetal growth, third trimester, not applicable or  unspecified IUGR  Herpes simplex virus (HSV)                     O98.519 B00.9  Negative AFP(Negative INVITAE)  Encounter for other antenatal screening        Z36.2  follow-up  Polyhydramnios, third trimester, antepartum    O40.3XX0  condition or complication, unspecified fetus  ---------------------------------------------------------------------- Fetal Evaluation  Num Of Fetuses:         1  Fetal Heart Rate(bpm):  157  Cardiac Activity:       Observed  Presentation:           Cephalic  Placenta:               Posterior  P. Cord Insertion:      Previously Visualized  Amniotic Fluid  AFI FV:      Polyhydramnios  AFI Sum(cm)     %Tile       Largest Pocket(cm)  29.6            > 97        9  RUQ(cm)       RLQ(cm)       LUQ(cm)        LLQ(cm)  7.8           6.8           6              9  ---------------------------------------------------------------------- Biophysical Evaluation  Amniotic F.V:   Pocket => 2 cm             F. Tone:  Observed  F. Movement:    Observed                   Score:          8/8  F. Breathing:   Observed ---------------------------------------------------------------------- Biometry  BPD:      82.9  mm     G. Age:  33w 2d         25  %    CI:        72.37   %    70 - 86                                                          FL/HC:      18.0   %    19.4 - 21.8  HC:       310   mm     G. Age:  34w 4d         26  %    HC/AC:      1.06        0.96 - 1.11  AC:      293.1  mm     G. Age:  33w 2d         29  %    FL/BPD:     67.2   %    71 - 87  FL:       55.7  mm     G. Age:  29w 2d        < 1  %    FL/AC:      19.0   %    20 - 24  HUM:      49.9  mm     G. Age:  29w 2d        < 5  %  Est. FW:    1941  gm      4 lb 4 oz      6  % ---------------------------------------------------------------------- OB History  Gravidity:    2  Living:       0 ---------------------------------------------------------------------- Gestational Age  LMP:           34w 1d        Date:  03/19/20                 EDD:   12/24/20  U/S Today:     32w 4d                                        EDD:   01/04/21  Best:          34w 1d     Det. By:  LMP  (03/19/20)          EDD:   12/24/20 ---------------------------------------------------------------------- Anatomy  Cranium:                Appears normal         LVOT:                   Appears normal  Cavum:                 Previously seen  Aortic Arch:            Previously seen  Ventricles:            Previously seen        Ductal Arch:            Previously seen  Choroid Plexus:        Appears normal         Diaphragm:              Appears normal  Cerebellum:            Previously seen        Stomach:                Appears normal, left                                                                        sided  Posterior Fossa:       Previously seen        Abdomen:                Hyperchogenic                                                                        bowel  Nuchal Fold:           Previously seen        Abdominal Wall:         Previously seen  Face:                  Appears normal         Cord Vessels:           Previously seen                         (orbits and profile)  Lips:                  Previously seen        Kidneys:                Appear normal  Palate:                Previously seen        Bladder:                Appears normal  Thoracic:              Previously seen        Spine:                  Previously seen  Heart:                 Previously seen        Upper Extremities:      Previously seen  RVOT:  Appears normal         Lower Extremities:      Previously seen  Other:  Heels, open hands visualized previously. Female gender previously          seen. ---------------------------------------------------------------------- Doppler - Fetal Vessels  Umbilical Artery   S/D     %tile      RI    %tile      PI    %tile            ADFV    RDFV   2.46       46    0.59       51     0.9       58               No      No ---------------------------------------------------------------------- Impression  Fetal growth restriction.  Patient return for fetal growth  assessment and antenatal testing.  She was evaluated at the MAU for hypertension on  11/07/2020.  Patient received antenatal corticosteroids.  Labs   including liver enzymes and platelets were within normal  range.  Patient does not have gestational diabetes.  Blood pressures today at our office were 155/102 and  138/100 mmHg.  On ultrasound, severe polyhydramnios is seen.  (AFI 30 cm).  Hyperechogenic bowel is seen (consistent with fetal growth  restriction).  Fetal stomach is present.  Both kidneys appear  normal.  Good fetal activity is present.  The estimated fetal  weight is at the 6 percentile.  Interval weight gain is 592 g  over 3 weeks.  Antenatal testing is reassuring. BPP 8/8.  Nuchal artery  Doppler showed normal forward diastolic flow.  I explained the diagnosis of gestational hypertension that is  associated with increased to maternal or fetal complications.  She also has fetal growth restriction and polyhydramnios.  All  these increase the risk of adverse fetal or neonatal outcomes.  I discussed the options:  a) Inpatient management with daily NST and twice weekly  fetal testing (recommended).  Patient does not want to be  admitted now.  b) MAU evaluation today for series of blood pressure  monitoring.  Patient reports she has blood pressure cuff at  home and we discussed the parameters again.  She does not  want to go to the MAU.  c) Twice weekly BPP, NST and UA Doppler at our office,  which I consider an inferior option.  Patient would like to discuss with Dr. Grace Bushy and decide.  I  told her Dr. Grace Bushy will be calling her tomorrow.  She was made aware that gestational hypertension is  associated with increased maternal complications. ---------------------------------------------------------------------- Recommendations  Patient has an appointment to return for antenatal testing on  11/20/2020. ----------------------------------------------------------------------                  Noralee Space, MD Electronically Signed Final Report   11/13/2020 05:52 pm ----------------------------------------------------------------------  [place] MFM UA CORD  DOPPLER  Result Date: 11/06/2020 ----------------------------------------------------------------------  OBSTETRICS REPORT                       (Signed Final 11/06/2020 07:17 pm) ---------------------------------------------------------------------- Patient Info  ID #:       [id number]                          D.O.B.:  October 29, 1990 (29 yrs)  Name:       Angela Taylor  Visit Date: 11/06/2020 04:33 pm ---------------------------------------------------------------------- Performed By  Attending:        Noralee Space MD        Ref. Address:     [address]                                                             [place], [place]                                                             [address]  Performed By:     Eden Lathe BS      Location[facility] RVT                                 [facility]  Referred By:      Brock Bad MD ---------------------------------------------------------------------- Orders  #  Description                           Code        Ordered By  1  [place] MFM FETAL BPP WO NON               76819.01    YU FANG     STRESS  2  [place] MFM UA CORD DOPPLER                60454.09    YU FANG ----------------------------------------------------------------------  #  Order #                     Accession #  Episode #  1  [id number]                   [id number]                 [id number]  2  [id number]                   [id number]                 [id number] ---------------------------------------------------------------------- Indications  Maternal care for known or suspected poor      O36.5930  fetal growth, third trimester, not applicable or  unspecified IUGR  Herpes simplex virus (HSV)                      O98.519 B00.9  Negative AFP(Negative INVITAE)  Encounter for other antenatal screening        Z36.2  follow-up  [redacted] weeks gestation of pregnancy                Z3A.33  Polyhydramnios, third trimester, antepartum    O40.3XX0  condition or complication, unspecified fetus ---------------------------------------------------------------------- Fetal Evaluation  Num Of Fetuses:         1  Fetal Heart Rate(bpm):  144  Cardiac Activity:       Observed  Presentation:           Cephalic  Amniotic Fluid  AFI FV:      Polyhydramnios  AFI Sum(cm)     %Tile       Largest Pocket(cm)  30.8            > 97        8.6  RUQ(cm)       RLQ(cm)       LUQ(cm)        LLQ(cm)  8.6           6.8           7.3            8.1 ---------------------------------------------------------------------- Biophysical Evaluation  Amniotic F.V:   Within normal limits       F. Tone:        Observed  F. Movement:    Observed                   Score:          8/8  F. Breathing:   Observed ---------------------------------------------------------------------- OB History  Gravidity:    2  Living:       0 ---------------------------------------------------------------------- Gestational Age  LMP:           33w 1d        Date:  03/19/20                 EDD:   12/24/20  Best:          33w 1d     Det. By:  LMP  (03/19/20)          EDD:   12/24/20 ---------------------------------------------------------------------- Doppler - Fetal Vessels  Umbilical Artery   S/D     %tile      RI    %tile                             ADFV    RDFV   2.36       34    0.58       41  No      No ---------------------------------------------------------------------- Impression  Fetal growth restriction.  On previous ultrasound, the  estimated fetal weight was at the 5th percentile.  Patient  returned for antenatal testing  On today's ultrasound, severe polyhydramnios is seen.  Antenatal testing is reassuring.  BPP 8/8.  Umbilical artery  Doppler showed  normal forward diastolic flow.  Cephalic  presentation.  Patient does not have gestational diabetes.  I explained the  findings that polyhydramnios is usually idiopathic (no known  cause).  It can lead to preterm labor. ---------------------------------------------------------------------- Recommendations  Fetal growth assessment and antenatal testing next week. ----------------------------------------------------------------------                  Noralee Space, MD Electronically Signed Final Report   11/06/2020 07:17 pm ----------------------------------------------------------------------  [place] MFM UA CORD DOPPLER  Result Date: 10/30/2020 ----------------------------------------------------------------------  OBSTETRICS REPORT                       (Signed Final 10/30/2020 05:18 pm) ---------------------------------------------------------------------- Patient Info  ID #:       [id number]                          D.O.B.:  1991-01-04 (29 yrs)  Name:       Angela Taylor                  Visit Date: 10/30/2020 03:15 pm ---------------------------------------------------------------------- Performed By  Attending:        Lin Landsman      Ref. Address:     [address]                    [address]                                                             [place], [place]                                                             [address]  Performed By:     Tommie Raymond BS,       Location:         [facility]                    RDMS, RVT                                [facility]  Referred By:      Brock Bad MD ---------------------------------------------------------------------- Orders  #  Description                           Code        Ordered By  1  [place] MFM UA CORD  DOPPLER                76820.02    RAVI SHANKAR  2  [place] MFM FETAL BPP WO NON               76819.01    YU FANG     STRESS ----------------------------------------------------------------------  #  Order #                     Accession #                Episode #  1  [id number]                   [id number]                 [id number]  2  [id number]                   [id number]                 [id number] ---------------------------------------------------------------------- Indications  [redacted] weeks gestation of pregnancy                Z3A.32  Maternal care for known or suspected poor      O36.5931  fetal growth, third trimester, fetus 1 IUGR  Herpes simplex virus (HSV)                     O98.519 B00.9  Negative AFP(Negative INVITAE)  Encounter for other antenatal screening        Z36.2  follow-up ---------------------------------------------------------------------- Fetal Evaluation  Num Of Fetuses:         1  Fetal Heart Rate(bpm):  143  Cardiac Activity:       Observed  Presentation:           Cephalic  Placenta:               Posterior  P. Cord Insertion:      Previously Visualized  Amniotic Fluid  AFI FV:      Subjectively upper-normal  AFI Sum(cm)     %Tile       Largest Pocket(cm)  26              96          8.9  RUQ(cm)       RLQ(cm)       LUQ(cm)        LLQ(cm)  8.9           5.4           6.3  5.4 ---------------------------------------------------------------------- Biophysical Evaluation  Amniotic F.V:   Pocket => 2 cm             F. Tone:        Observed  F. Movement:    Observed                   Score:          8/8  F. Breathing:   Observed ---------------------------------------------------------------------- Biometry  LV:          6  mm ---------------------------------------------------------------------- OB History  Gravidity:    2  Living:       0 ---------------------------------------------------------------------- Gestational Age  LMP:           32w 1d        Date:  03/19/20                  EDD:   12/24/20  Best:          32w 1d     Det. By:  LMP  (03/19/20)          EDD:   12/24/20 ---------------------------------------------------------------------- Anatomy  Cranium:               Previously seen        LVOT:                   Previously seen  Cavum:                 Previously seen        Aortic Arch:            Previously seen  Ventricles:            Appears normal         Ductal Arch:            Previously seen  Choroid Plexus:        Previously seen        Diaphragm:              Appears normal  Cerebellum:            Previously seen        Stomach:                Appears normal, left                                                                        sided  Posterior Fossa:       Previously seen        Abdomen:                Previously seen  Nuchal Fold:           Previously seen        Abdominal Wall:         Previously seen  Face:                  Orbits and profile     Cord Vessels:           Previously seen  previously seen  Lips:                  Previously seen        Kidneys:                Appear normal  Palate:                Previously seen        Bladder:                Appears normal  Thoracic:              Previously seen        Spine:                  Previously seen  Heart:                 Appears normal         Upper Extremities:      Previously seen  RVOT:                  Previously seen        Lower Extremities:      Previously seen  Other:  Heels, open hands visualized previously. Female gender previously          seen. ---------------------------------------------------------------------- Doppler - Fetal Vessels  Umbilical Artery   S/D     %tile      RI    %tile                             ADFV    RDFV   2.33       28    0.57       31                                No      No ---------------------------------------------------------------------- Cervix Uterus Adnexa  Cervix  Not visualized (advanced GA >24wks)  Uterus  No abnormality visualized.   Right Ovary  Within normal limits.  Left Ovary  Within normal limits.  Cul De Sac  No free fluid seen.  Adnexa  No abnormality visualized. ---------------------------------------------------------------------- Impression  Antenatal testing due to IUGR with an EFW 5th% with an AC  < 22th%.  Biophysical profile 8/8 with good fetal movement and  amniotic fluid volume  UA Dopplers are normal with no evidence of AEDF or REDF ---------------------------------------------------------------------- Recommendations  Continue weekly testing with UA Dopplers.  Repeat growth in 3-4 weeks.  Determine delivery at next growth. ----------------------------------------------------------------------               Lin Landsman, MD Electronically Signed Final Report   10/30/2020 05:18 pm ----------------------------------------------------------------------   Assessment and Plan:  Pregnancy: G2P0010 at [redacted]w[redacted]d 1. Gestational hypertension, third trimester 2. Poor fetal growth affecting management of mother in third trimester, single or unspecified fetus Concerned about possible preeclampsia. Will send to MAU for further evaluation. Needs BPP, UA dopplers today.  MAU providers notified.  Delivery plan will depend on findings.   3. [redacted] weeks gestation of pregnancy 4. Supervision of high risk pregnancy in third trimester Pelvic cultures to be done today at MAU.  Preterm labor symptoms and general obstetric precautions including but not limited to vaginal bleeding, contractions, leaking of fluid and fetal movement were reviewed in detail with the patient.  Please refer to After Visit Summary for other counseling recommendations.   Return in about 1 week (around 11/27/2020) for OFFICE OB VISIT ([facility] only), if undelivered.  Future Appointments  Date Time Provider Department Center  11/20/2020  2:30 PM [facility] NURSE [facility]  11/20/2020  2:45 PM [facility]  11/27/2020  8:30 AM Warden Fillers, MD [facility] None   11/27/2020  2:30 PM [facility] NURSE [facility]  11/27/2020  2:45 PM [facility] US5 [facility]    Jaynie Collins, MD

## 2020-11-20 NOTE — Progress Notes (Addendum)
Patient Vitals for the past 4 hrs:  BP Pulse Resp  11/20/20 2200 (!) 153/99 85 18  11/20/20 2105 (!) 144/100 86 17  11/20/20 2100 -- 83 --   Pt had a HA ("in my face and back of my head" that came down to 3/10 w/fioricet, but back to 10/10 about 2 hours later.  Responded very well (0/10) to oxycodone 10mg .  Cx LTC/-2/vtx.  FHR Cat 1, rare ctx.  MgSO4 @ 2gm/hr. HELLP labs negative. Cytotec given buccally.  Will monitor HA; alternate w/fioricet and oxycodone 5mg  prn Start GBS ppx.  Culture in progress. Plan foley>pitocin.

## 2020-11-20 NOTE — Progress Notes (Signed)
+   Fetal movement. Pt c/o neck pain and left inguinal pain. Showering helps with pain. Pt BP elevated. Pt states she is taking BP at home. States it is elevated with movement and lower with rest, 130s/90. Denies HA, dizziness, visual changes. Urine dip TR protein/Neg glucose.

## 2020-11-20 NOTE — MAU Note (Signed)
Angela Taylor is a 30 y.o. at [redacted]w[redacted]d here in MAU reporting: was sent over from the office for HTN. Denies headache, blurry vision. Having some pelvic. No bleeding or LOF. +FM  Onset of complaint: today  Pain score: 4/10  Vitals:   11/20/20 1227  BP: (!) 154/105  Pulse: 78  Resp: 16  Temp: 98.7 F (37.1 C)  SpO2: 100%     FHT: +FM EFM applied in room  Lab orders placed from triage: UA

## 2020-11-20 NOTE — H&P (Signed)
Angela Taylor is a 30 y.o. female presenting for IOL in the setting of Severe Preeclampsia, IUGR and Polyhydramnios. On admission she denies headache, visual disturbances, RUQ/epigastric pain, new onset swelling or weight gain. She also denies contractions, vaginal bleeding, DFM, LOF, fever or recent illness.  OB History    Gravida  2   Para  0   Term  0   Preterm  0   AB  1   Living  0     SAB  1   IAB  0   Ectopic  0   Multiple  0   Live Births  0          Nursing Staff Provider  Office Location  [facility] Dating  LMP  Language   English Anatomy US  IUGR  Flu Vaccine  declined Genetic Screen  NIPS: Negative  AFP:  Negative    TDaP Vaccine  declined Hgb A1C or  GTT Early  Third trimester: Normal Glucose, Fasting 65 - 91 mg/dL 78   Glucose, 1 hour 65 - 179 mg/dL 631   Glucose, 2 hour 65 - 152 mg/dL 497       COVID Vaccine Did not receive   LAB RESULTS   Rhogam  NA Blood Type B/Positive/-- (10/06 1416)   Feeding Plan Both Antibody Negative (10/06 1416)  Contraception None Rubella 1.78 (10/06 1416)  Circumcision Yes if female RPR Non Reactive (12/02 1002)   Pediatrician  List given (07/23/20) HBsAg Negative (10/06 1416)   Support Person Husband Luisa Hart) HCVAb   Prenatal Classes  Planning to  HIV Non Reactive (12/02 1002)     BTL Consent  GBS   (For PCN allergy, check sensitivities)    Past Medical History:  Diagnosis Date  . Angio-edema   . STD (sexually transmitted disease) 2014   Herpes- has not had outbreak  . Urticaria    Past Surgical History:  Procedure Laterality Date  . WISDOM TOOTH EXTRACTION     Family History: family history includes Allergic rhinitis in her brother, brother, and mother; Asthma in her brother, brother, and mother; Bipolar disorder in her brother and maternal aunt; Dementia in her maternal grandmother; Diabetes in her brother and mother; Kidney Stones in her mother; Schizophrenia in her cousin. Social History:  reports that she  quit smoking about 3 years ago. Her smoking use included cigarettes. She has never used smokeless tobacco. She reports previous alcohol use. She reports that she does not use drugs.    Maternal Diabetes: No Genetic Screening: Normal Maternal Ultrasounds/Referrals: IUGR, Polyhydramnios Fetal Ultrasounds or other Referrals:  Referred to Materal Fetal Medicine  Maternal Substance Abuse:  No Significant Maternal Medications:  None Significant Maternal Lab Results:  GBS Unknown, collected in MAU 11/20/2020 Other Comments:  PCN allergy  Review of Systems  Respiratory: Negative for shortness of breath.   Gastrointestinal: Negative for abdominal pain.  Genitourinary: Positive for pelvic pain. Negative for vaginal bleeding.  Musculoskeletal: Negative for back pain.  Neurological: Negative for headaches.  All other systems reviewed and are negative.    Blood pressure (!) 149/111, pulse 78, temperature 98.7 F (37.1 C), temperature source Oral, resp. rate 16, last menstrual period 03/19/2020, SpO2 100 %, unknown if currently breastfeeding.   Patient Vitals for the past 24 hrs:  BP Temp Temp src Pulse Resp SpO2  11/20/20 1435 - - - - - 100 %  11/20/20 1431 (!) 149/111 - - 78 - -  11/20/20 1418 (!) 144/107 - - 80 - -  11/20/20 1401 (!) 151/101 - - 76 - -  11/20/20 1400 - - - - - 100 %  11/20/20 1346 (!) 152/108 - - 76 - -  11/20/20 1345 - - - - - 100 %  11/20/20 1330 (!) 153/103 - - 78 - 100 %  11/20/20 1315 (!) 160/104 - - 75 - 100 %  11/20/20 1245 (!) 143/99 - - 80 - 99 %  11/20/20 1227 (!) 154/105 98.7 F (37.1 C) Oral 78 16 100 %     Physical Exam Vitals and nursing note reviewed. Exam conducted with a chaperone present.  Constitutional:      Appearance: Normal appearance.  Cardiovascular:     Rate and Rhythm: Normal rate.     Pulses: Normal pulses.     Heart sounds: Normal heart sounds.  Pulmonary:     Effort: Pulmonary effort is normal.     Breath sounds: Normal breath  sounds.  Abdominal:     Comments: Gravid  Skin:    Capillary Refill: Capillary refill takes less than 2 seconds.  Neurological:     Mental Status: She is alert and oriented to person, place, and time.  Psychiatric:        Mood and Affect: Mood normal.        Behavior: Behavior normal.        Thought Content: Thought content normal.        Judgment: Judgment normal.     Prenatal labs: ABO, Rh: --/--/B POS (02/03 1320) Antibody: NEG (02/03 1320) Rubella: 1.78 (10/06 1416) RPR: Non Reactive (12/02 1002)  HBsAg: Negative (10/06 1416)  HIV: Non Reactive (12/02 1002)  GBS:   Unknown, collected, sensitivities requested  Fetal Surveillance --Cat I tracing --Baseline 135, mod var, + 15 x 15 accels, no decels --Toco: irregular ctx --EFW 4lb 4oz (6%) at 34w 1d --BPP 8/8, reassuring Dopplers today while in MAU   Assessment/Plan: --30 y.o. G2P0010 at [redacted]w[redacted]d  --Preeclampsia with Severe Features --Magnesium Sulfate initiated in MAU --Polyhydramnios --IUGR --GBS unknown (PCN allergy) --Cat I tracing --Cephalic via BSUS --S/p BMZ 11/07/2020 and 11/08/2020 --S/p consult with Dr. Jolayne Panther and Dr. Donavan Foil, admit to L&D --Admission coordinated with Dr. Mikle Bosworth, Neonatologist --Report called to Tyler Aas, MSN, CNM, IBCLC  Calvert Cantor, CNM 11/20/2020, 3:24 PM

## 2020-11-21 LAB — RPR: RPR Ser Ql: NONREACTIVE

## 2020-11-21 LAB — GC/CHLAMYDIA PROBE AMP (~~LOC~~) NOT AT ARMC
Chlamydia: NEGATIVE
Comment: NEGATIVE
Comment: NORMAL
Neisseria Gonorrhea: NEGATIVE

## 2020-11-21 MED ORDER — OXYTOCIN-SODIUM CHLORIDE 30-0.9 UT/500ML-% IV SOLN
1.0000 m[IU]/min | INTRAVENOUS | Status: DC
  Administered 2020-11-21: 2 m[IU]/min via INTRAVENOUS

## 2020-11-21 MED ORDER — TERBUTALINE SULFATE 1 MG/ML IJ SOLN: 0.2500 mg | Freq: Once | INTRAMUSCULAR | Status: DC | PRN

## 2020-11-21 MED ORDER — LABETALOL HCL 200 MG PO TABS
200.0000 mg | ORAL_TABLET | Freq: Two times a day (BID) | ORAL | Status: DC
  Administered 2020-11-21 – 2020-11-22 (×3): 200 mg via ORAL
  Filled 2020-11-21 (×3): qty 1

## 2020-11-21 NOTE — Progress Notes (Signed)
Labor Progress Note Angela Taylor is a 30 y.o. G2P0010 at [redacted]w[redacted]d presented for PEC w SF  S: Still processing fact that she is going to have baby soon. HA much improved from earlier.   O:  BP (!) 145/94   Pulse 98   Temp 98.4 F (36.9 C) (Oral)   Resp 16   Ht 5\' 1"  (1.549 m)   Wt 65.8 kg   LMP 03/19/2020   SpO2 99%   BMI 27.40 kg/m  EFM: baseline 130/mod to min variability/pos accels/no decels    Intake/Output Summary (Last 24 hours) at 11/21/2020 1620 Last data filed at 11/21/2020 1500 Gross per 24 hour  Intake 3550.51 ml  Output 5350 ml  Net -1799.49 ml    CVE: Dilation: 1 Effacement (%): 50 Station: -2 Presentation: Vertex Exam by:: Dr. [email or web address]   A&P: 30 y.o. G2P0010 [redacted]w[redacted]d here for IOL for PEC w SF #Induction of Labor: s/p cytotec x4. FB placed without issue. Currently contracting too frequently for further cytotec. Will reassess in one hour.   #PEC w SF: on Mg 2gm/hr, excellent UOP. HA improved. BP have been MR today and not requiring IV antihypertensive. Started on labetalol 200 mg BID.  #Pain: pain meds, epidural prn  #FWB: cat I  #GBS unknown, on ppx w ancef 2/2 PCN allergy    [redacted]w[redacted]d, MD 4:15 PM

## 2020-11-21 NOTE — Progress Notes (Signed)
Patient Vitals for the past 4 hrs:  BP Temp Temp src Pulse Resp  11/21/20 0731 (!) 148/97 -- -- 99 16  11/21/20 0717 (!) 151/100 98.5 F (36.9 C) Oral 98 15  11/21/20 0700 (!) 153/112 -- -- 97 --  11/21/20 0631 (!) 153/107 -- -- 95 15  11/21/20 0601 (!) 147/100 -- -- 89 18  11/21/20 0531 (!) 151/100 -- -- 93 16  11/21/20 0501 (!) 150/102 -- -- 87 15  11/21/20 0436 (!) 142/90 98.4 F (36.9 C) Oral 79 16   No headache.  MgSO4 at 2gm/hr. FHR Cat 1.  Rare ctx . 3rd cytotec given.  Cx FT/50/-3 per RN> continue present mgt.  Will start oral labetalol

## 2020-11-21 NOTE — Progress Notes (Signed)
LABOR PROGRESS NOTE  Angela Taylor is a 30 y.o. G2P0010 at [redacted]w[redacted]d  admitted for IOL for severe PEC.   Subjective: Patient breathing through contractions at this time, rating pain 6-7/10, declines wanting medication. Educated and discussed IV pain medication. Patient does not want epidural   Objective: BP (!) 142/82   Pulse 97   Temp 99.3 F (37.4 C) (Oral)   Resp 18   Ht 5\' 1"  (1.549 m)   Wt 65.8 kg   LMP 03/19/2020   SpO2 99%   BMI 27.40 kg/m  or  Vitals:   11/21/20 2031 11/21/20 2104 11/21/20 2145 11/21/20 2159  BP: (!) 154/98 (!) 148/101 (!) 143/99 (!) 142/82  Pulse: 100 94 95 97  Resp: 16 18 18    Temp:      TempSrc:      SpO2:      Weight:      Height:        FB out and cervix 4cm  Dilation: 4 Effacement (%): 80 Station: -2 Presentation: Vertex Exam by:: , CNM FHT: baseline rate 135, minimal/mod varibility, +accel, no decel Toco: 2-5/ moderate by palpation   Labs: Lab Results  Component Value Date   WBC 11.0 (H) 11/20/2020   HGB 13.3 11/20/2020   HCT 40.4 11/20/2020   MCV 97.6 11/20/2020   PLT 276 11/20/2020    Patient Active Problem List   Diagnosis Date Noted  . Severe preeclampsia, third trimester 11/20/2020  . Polyhydramnios 11/20/2020  . Indication for care in labor and delivery, antepartum 11/20/2020  . Gestational hypertension 11/08/2020  . IUGR (intrauterine growth restriction) affecting care of mother 08/21/2020  . Supervision of high-risk pregnancy 07/23/2020    Assessment / Plan: 30 y.o. G2P0010 at [redacted]w[redacted]d here for IOL for severe PEC   Labor: Educated and discussed with patient plan of care for induction at this time. Recommends initiation of pitocin due to cervical examination of 4cm. Patient is concerned that she may have an allergic reaction to pitocin, discussed with patient that we can start pit on low dose and leave for an hour to monitor for adverse reaction then gradually increase every 30 minutes. Patient verbalizes  understanding and agrees with plan of care.   Fetal Wellbeing:  Cat I  Pain Control:  Medication PRN  Anticipated MOD:  SVD   34, CNM 11/21/2020, 10:09 PM

## 2020-11-21 NOTE — Progress Notes (Incomplete)
Labor Progress Note Angela Taylor is a 30 y.o. G2P0010 at [redacted]w[redacted]d presented for IOL in the setting of Severe Preeclampsia, IUGR and Polyhydramnios. S: Patient is resting comfortably.  O:  BP (!) 147/96   Pulse 91   Temp 98.5 F (36.9 C) (Oral)   Resp 16   Ht 5\' 1"  (1.549 m)   Wt 65.8 kg   LMP 03/19/2020   SpO2 99%   BMI 27.40 kg/m  EFM: ***/***/***  CVE: Dilation: Fingertip Effacement (%): Thick Station: Ballotable Exam by:: [email or web address] RN   A&P: 30 y.o. G2P0010 [redacted]w[redacted]d IOL in the setting of Severe Preeclampsia, IUGR and Polyhydramnios. #Labor: Progressing well. *** #Pain:  #FWB: *** #GBS {pos/neg/not [redacted]w[redacted]d *** (chronic/other problems) #Preeclampsia with Severe Features  [id number]}, DO [facility], [facility] 11:19 AM

## 2020-11-22 ENCOUNTER — Inpatient Hospital Stay (HOSPITAL_COMMUNITY): Payer: BC Managed Care – PPO | Admitting: Anesthesiology

## 2020-11-22 ENCOUNTER — Encounter (HOSPITAL_COMMUNITY)
Admission: AD | Disposition: A | Discharge status: Home / Self Care | Payer: Self-pay | Source: Home / Self Care | Attending: Obstetrics and Gynecology

## 2020-11-22 ENCOUNTER — Encounter (HOSPITAL_COMMUNITY): Payer: Self-pay | Admitting: Obstetrics and Gynecology

## 2020-11-22 DIAGNOSIS — O1414 Severe pre-eclampsia complicating childbirth: Secondary | ICD-10-CM

## 2020-11-22 LAB — COMPREHENSIVE METABOLIC PANEL
ALT: 15 U/L (ref 0–44)
AST: 15 U/L (ref 15–41)
Albumin: 2.6 g/dL — ABNORMAL LOW (ref 3.5–5.0)
Alkaline Phosphatase: 146 U/L — ABNORMAL HIGH (ref 38–126)
Anion gap: 11 (ref 5–15)
BUN: 5 mg/dL — ABNORMAL LOW (ref 6–20)
CO2: 19 mmol/L — ABNORMAL LOW (ref 22–32)
Calcium: 6.5 mg/dL — ABNORMAL LOW (ref 8.9–10.3)
Chloride: 107 mmol/L (ref 98–111)
Creatinine, Ser: 0.5 mg/dL (ref 0.44–1.00)
GFR, Estimated: >60 mL/min (ref >60)
Glucose, Bld: 130 mg/dL — ABNORMAL HIGH (ref 70–99)
Potassium: 3.8 mmol/L (ref 3.5–5.1)
Sodium: 137 mmol/L (ref 135–145)
Total Bilirubin: 0.5 mg/dL (ref 0.3–1.2)
Total Protein: 6 g/dL — ABNORMAL LOW (ref 6.5–8.1)

## 2020-11-22 LAB — OB RESULTS CONSOLE GBS: GBS: NEGATIVE

## 2020-11-22 LAB — CBC
HCT: 36 % (ref 36.0–46.0)
HCT: 33.1 % — ABNORMAL LOW (ref 36.0–46.0)
Hemoglobin: 12 g/dL (ref 12.0–15.0)
Hemoglobin: 11.2 g/dL — ABNORMAL LOW (ref 12.0–15.0)
MCH: 32.3 pg (ref 26.0–34.0)
MCH: 33.2 pg (ref 26.0–34.0)
MCHC: 33.3 g/dL (ref 30.0–36.0)
MCHC: 33.8 g/dL (ref 30.0–36.0)
MCV: 97 fL (ref 80.0–100.0)
MCV: 98.2 fL (ref 80.0–100.0)
Platelets: 242 10*3/uL (ref 150–400)
Platelets: 235 10*3/uL (ref 150–400)
RBC: 3.71 MIL/uL — ABNORMAL LOW (ref 3.87–5.11)
RBC: 3.37 MIL/uL — ABNORMAL LOW (ref 3.87–5.11)
RDW: 13.1 % (ref 11.5–15.5)
RDW: 13.3 % (ref 11.5–15.5)
WBC: 13.4 10*3/uL — ABNORMAL HIGH (ref 4.0–10.5)
WBC: 9.9 10*3/uL (ref 4.0–10.5)
nRBC: 0 % (ref 0.0–0.2)
nRBC: 0 % (ref 0.0–0.2)

## 2020-11-22 LAB — CULTURE, BETA STREP (GROUP B ONLY)

## 2020-11-22 SURGERY — Surgical Case
Anesthesia: Epidural | Site: Abdomen | Wound class: Clean Contaminated

## 2020-11-22 MED ORDER — ONDANSETRON HCL 4 MG/2ML IJ SOLN: 4.0000 mg | Freq: Once | INTRAMUSCULAR | Status: DC | PRN

## 2020-11-22 MED ORDER — ONDANSETRON HCL 4 MG/2ML IJ SOLN
INTRAMUSCULAR | Status: DC | PRN
  Administered 2020-11-22: 4 mg via INTRAVENOUS

## 2020-11-22 MED ORDER — EPHEDRINE 5 MG/ML INJ: 10.0000 mg | INTRAVENOUS | Status: DC | PRN

## 2020-11-22 MED ORDER — MAGNESIUM SULFATE 40 GM/1000ML IV SOLN
2.0000 g/h | INTRAVENOUS | Status: AC
  Administered 2020-11-23 (×2): 2 g/h via INTRAVENOUS
  Filled 2020-11-22: qty 1000

## 2020-11-22 MED ORDER — DIPHENHYDRAMINE HCL 50 MG/ML IJ SOLN
INTRAMUSCULAR | Status: AC
  Filled 2020-11-22: qty 1

## 2020-11-22 MED ORDER — MORPHINE SULFATE (PF) 0.5 MG/ML IJ SOLN
INTRAMUSCULAR | Status: DC | PRN
  Administered 2020-11-22: 1 mg via EPIDURAL

## 2020-11-22 MED ORDER — TETANUS-DIPHTH-ACELL PERTUSSIS 5-2.5-18.5 LF-MCG/0.5 IM SUSY: 0.5000 mL | PREFILLED_SYRINGE | Freq: Once | INTRAMUSCULAR | Status: DC

## 2020-11-22 MED ORDER — POLYETHYLENE GLYCOL 3350 17 G PO PACK
17.0000 g | PACK | Freq: Every day | ORAL | Status: DC
  Administered 2020-11-24: 17 g via ORAL
  Filled 2020-11-22 (×2): qty 1

## 2020-11-22 MED ORDER — MORPHINE SULFATE (PF) 0.5 MG/ML IJ SOLN
INTRAMUSCULAR | Status: AC
  Filled 2020-11-22: qty 10

## 2020-11-22 MED ORDER — ENOXAPARIN SODIUM 40 MG/0.4ML ~~LOC~~ SOLN
40.0000 mg | SUBCUTANEOUS | Status: DC
  Administered 2020-11-23 – 2020-11-24 (×2): 40 mg via SUBCUTANEOUS
  Filled 2020-11-22 (×2): qty 0.4

## 2020-11-22 MED ORDER — OXYCODONE HCL 5 MG/5ML PO SOLN: 5.0000 mg | Freq: Once | ORAL | Status: DC | PRN

## 2020-11-22 MED ORDER — FENTANYL CITRATE (PF) 100 MCG/2ML IJ SOLN
INTRAMUSCULAR | Status: AC
  Filled 2020-11-22: qty 2

## 2020-11-22 MED ORDER — LACTATED RINGERS IV SOLN: INTRAVENOUS | Status: DC | PRN

## 2020-11-22 MED ORDER — PHENYLEPHRINE 40 MCG/ML (10ML) SYRINGE FOR IV PUSH (FOR BLOOD PRESSURE SUPPORT)
PREFILLED_SYRINGE | INTRAVENOUS | Status: AC
  Filled 2020-11-22: qty 10

## 2020-11-22 MED ORDER — LACTATED RINGERS IV SOLN: 500.0000 mL | Freq: Once | INTRAVENOUS | Status: DC

## 2020-11-22 MED ORDER — METOCLOPRAMIDE HCL 5 MG/ML IJ SOLN
INTRAMUSCULAR | Status: DC | PRN
  Administered 2020-11-22: 10 mg via INTRAVENOUS

## 2020-11-22 MED ORDER — MORPHINE SULFATE (PF) 0.5 MG/ML IJ SOLN
INTRAMUSCULAR | Status: DC | PRN
  Administered 2020-11-22: 3 mg via EPIDURAL

## 2020-11-22 MED ORDER — CEFAZOLIN SODIUM-DEXTROSE 2-3 GM-%(50ML) IV SOLR
INTRAVENOUS | Status: DC | PRN
  Administered 2020-11-22: 2 g via INTRAVENOUS

## 2020-11-22 MED ORDER — DIPHENHYDRAMINE HCL 50 MG/ML IJ SOLN: 12.5000 mg | INTRAMUSCULAR | Status: DC | PRN

## 2020-11-22 MED ORDER — ONDANSETRON HCL 4 MG/2ML IJ SOLN
INTRAMUSCULAR | Status: AC
  Filled 2020-11-22: qty 2

## 2020-11-22 MED ORDER — PHENYLEPHRINE 40 MCG/ML (10ML) SYRINGE FOR IV PUSH (FOR BLOOD PRESSURE SUPPORT): 80.0000 ug | PREFILLED_SYRINGE | INTRAVENOUS | Status: DC | PRN

## 2020-11-22 MED ORDER — ACETAMINOPHEN 160 MG/5ML PO SOLN: 325.0000 mg | ORAL | Status: DC | PRN

## 2020-11-22 MED ORDER — SODIUM CHLORIDE 0.9 % IV SOLN
INTRAVENOUS | Status: AC
  Filled 2020-11-22: qty 500

## 2020-11-22 MED ORDER — CLINDAMYCIN PHOSPHATE 900 MG/50ML IV SOLN: 900.0000 mg | Freq: Once | INTRAVENOUS | Status: DC

## 2020-11-22 MED ORDER — FENTANYL-BUPIVACAINE-NACL 0.5-0.125-0.9 MG/250ML-% EP SOLN: 12.0000 mL/h | EPIDURAL | Status: DC | PRN

## 2020-11-22 MED ORDER — MAGNESIUM SULFATE 50 % IJ SOLN
INTRAMUSCULAR | Status: DC | PRN
  Administered 2020-11-22: 1 mg via INTRAVENOUS

## 2020-11-22 MED ORDER — SODIUM CHLORIDE 0.9 % IV SOLN: INTRAVENOUS | Status: DC | PRN

## 2020-11-22 MED ORDER — OXYTOCIN-SODIUM CHLORIDE 30-0.9 UT/500ML-% IV SOLN: 2.5000 [IU]/h | INTRAVENOUS | Status: DC

## 2020-11-22 MED ORDER — GENTAMICIN SULFATE 40 MG/ML IJ SOLN
5.0000 mg/kg | INTRAVENOUS | Status: DC
  Filled 2020-11-22: qty 8.25

## 2020-11-22 MED ORDER — FENTANYL CITRATE (PF) 100 MCG/2ML IJ SOLN
INTRAMUSCULAR | Status: DC | PRN
  Administered 2020-11-22: 100 ug via INTRAVENOUS

## 2020-11-22 MED ORDER — OXYCODONE HCL 5 MG PO TABS: 5.0000 mg | ORAL_TABLET | Freq: Once | ORAL | Status: DC | PRN

## 2020-11-22 MED ORDER — SOD CITRATE-CITRIC ACID 500-334 MG/5ML PO SOLN: 30.0000 mL | ORAL | Status: DC

## 2020-11-22 MED ORDER — FENTANYL CITRATE (PF) 100 MCG/2ML IJ SOLN: 25.0000 ug | INTRAMUSCULAR | Status: DC | PRN

## 2020-11-22 MED ORDER — MENTHOL 3 MG MT LOZG: 1.0000 | LOZENGE | OROMUCOSAL | Status: DC | PRN

## 2020-11-22 MED ORDER — COCONUT OIL OIL
1.0000 | TOPICAL_OIL | Status: DC | PRN
Start: 2020-11-22 — End: 2020-11-25

## 2020-11-22 MED ORDER — LIDOCAINE-EPINEPHRINE (PF) 2 %-1:200000 IJ SOLN
INTRAMUSCULAR | Status: DC | PRN
  Administered 2020-11-22 (×2): 5 mL via INTRADERMAL

## 2020-11-22 MED ORDER — ACETAMINOPHEN 325 MG PO TABS: 325.0000 mg | ORAL_TABLET | ORAL | Status: DC | PRN

## 2020-11-22 MED ORDER — OXYTOCIN-SODIUM CHLORIDE 30-0.9 UT/500ML-% IV SOLN
INTRAVENOUS | Status: AC
  Filled 2020-11-22: qty 500

## 2020-11-22 MED ORDER — DEXAMETHASONE SODIUM PHOSPHATE 4 MG/ML IJ SOLN
INTRAMUSCULAR | Status: AC
  Filled 2020-11-22: qty 1

## 2020-11-22 MED ORDER — GABAPENTIN 100 MG PO CAPS
100.0000 mg | ORAL_CAPSULE | Freq: Two times a day (BID) | ORAL | Status: DC
  Administered 2020-11-23 – 2020-11-25 (×5): 100 mg via ORAL
  Filled 2020-11-22 (×6): qty 1

## 2020-11-22 MED ORDER — SODIUM CHLORIDE 0.9 % IV SOLN
INTRAVENOUS | Status: DC | PRN
  Administered 2020-11-22: 500 mg via INTRAVENOUS

## 2020-11-22 MED ORDER — PHENYLEPHRINE 40 MCG/ML (10ML) SYRINGE FOR IV PUSH (FOR BLOOD PRESSURE SUPPORT)
80.0000 ug | PREFILLED_SYRINGE | INTRAVENOUS | Status: DC | PRN
  Filled 2020-11-22: qty 10

## 2020-11-22 MED ORDER — DEXAMETHASONE SODIUM PHOSPHATE 4 MG/ML IJ SOLN
INTRAMUSCULAR | Status: DC | PRN
  Administered 2020-11-22: 4 mg via INTRAVENOUS

## 2020-11-22 MED ORDER — SIMETHICONE 80 MG PO CHEW
80.0000 mg | CHEWABLE_TABLET | Freq: Three times a day (TID) | ORAL | Status: DC
  Administered 2020-11-23 – 2020-11-24 (×6): 80 mg via ORAL
  Filled 2020-11-22 (×6): qty 1

## 2020-11-22 MED ORDER — OXYCODONE HCL 5 MG PO TABS
5.0000 mg | ORAL_TABLET | ORAL | Status: DC | PRN
  Administered 2020-11-23 (×2): 10 mg via ORAL
  Administered 2020-11-24: 5 mg via ORAL
  Administered 2020-11-24 – 2020-11-25 (×3): 10 mg via ORAL
  Filled 2020-11-22 (×3): qty 2
  Filled 2020-11-22: qty 1
  Filled 2020-11-22 (×2): qty 2

## 2020-11-22 MED ORDER — FENTANYL-BUPIVACAINE-NACL 0.5-0.125-0.9 MG/250ML-% EP SOLN
12.0000 mL/h | EPIDURAL | Status: DC | PRN
  Filled 2020-11-22: qty 250

## 2020-11-22 MED ORDER — FENTANYL-BUPIVACAINE-NACL 0.5-0.125-0.9 MG/250ML-% EP SOLN
EPIDURAL | Status: DC | PRN
  Administered 2020-11-22: 12 mL/h via EPIDURAL

## 2020-11-22 MED ORDER — AMLODIPINE BESYLATE 10 MG PO TABS
10.0000 mg | ORAL_TABLET | Freq: Every day | ORAL | Status: DC
  Administered 2020-11-23 – 2020-11-25 (×3): 10 mg via ORAL
  Filled 2020-11-22 (×3): qty 1

## 2020-11-22 MED ORDER — DIBUCAINE (PERIANAL) 1 % EX OINT
1.0000 | TOPICAL_OINTMENT | CUTANEOUS | Status: DC | PRN
Start: 2020-11-22 — End: 2020-11-25

## 2020-11-22 MED ORDER — LACTATED RINGERS IV SOLN: INTRAVENOUS | Status: DC

## 2020-11-22 MED ORDER — DIPHENHYDRAMINE HCL 50 MG/ML IJ SOLN
INTRAMUSCULAR | Status: DC | PRN
  Administered 2020-11-22: 25 mg via INTRAVENOUS

## 2020-11-22 MED ORDER — ACETAMINOPHEN 10 MG/ML IV SOLN
1000.0000 mg | Freq: Once | INTRAVENOUS | Status: AC
  Administered 2020-11-22: 1000 mg via INTRAVENOUS

## 2020-11-22 MED ORDER — PRENATAL MULTIVITAMIN CH
1.0000 | ORAL_TABLET | Freq: Every day | ORAL | Status: DC
  Administered 2020-11-23 – 2020-11-25 (×3): 1 via ORAL
  Filled 2020-11-22 (×3): qty 1

## 2020-11-22 MED ORDER — DIPHENHYDRAMINE HCL 25 MG PO CAPS: 25.0000 mg | ORAL_CAPSULE | Freq: Four times a day (QID) | ORAL | Status: DC | PRN

## 2020-11-22 MED ORDER — MEPERIDINE HCL 25 MG/ML IJ SOLN: 6.2500 mg | INTRAMUSCULAR | Status: DC | PRN

## 2020-11-22 MED ORDER — ACETAMINOPHEN 10 MG/ML IV SOLN
INTRAVENOUS | Status: AC
  Filled 2020-11-22: qty 100

## 2020-11-22 MED ORDER — LACTATED RINGERS AMNIOINFUSION: INTRAVENOUS | Status: DC

## 2020-11-22 MED ORDER — LIDOCAINE HCL (PF) 1 % IJ SOLN
INTRAMUSCULAR | Status: DC | PRN
  Administered 2020-11-22: 8 mL via EPIDURAL

## 2020-11-22 MED ORDER — METOCLOPRAMIDE HCL 5 MG/ML IJ SOLN
INTRAMUSCULAR | Status: AC
  Filled 2020-11-22: qty 2

## 2020-11-22 MED ORDER — WITCH HAZEL-GLYCERIN EX PADS: 1.0000 "application " | MEDICATED_PAD | CUTANEOUS | Status: DC | PRN

## 2020-11-22 MED ORDER — SODIUM CHLORIDE 0.9 % IV SOLN: 500.0000 mg | INTRAVENOUS | Status: DC

## 2020-11-22 MED ORDER — IBUPROFEN 800 MG PO TABS
800.0000 mg | ORAL_TABLET | Freq: Three times a day (TID) | ORAL | Status: AC
  Administered 2020-11-23 – 2020-11-25 (×9): 800 mg via ORAL
  Filled 2020-11-22 (×9): qty 1

## 2020-11-22 SURGICAL SUPPLY — 36 items
BENZOIN TINCTURE PRP APPL 2/3 (GAUZE/BANDAGES/DRESSINGS) ×2 IMPLANT
CANISTER SUCT 3000ML PPV (MISCELLANEOUS) ×2 IMPLANT
CHLORAPREP W/TINT 26ML (MISCELLANEOUS) ×2 IMPLANT
DRSG OPSITE POSTOP 4X10 (GAUZE/BANDAGES/DRESSINGS) ×2 IMPLANT
ELECT REM PT RETURN 9FT ADLT (ELECTROSURGICAL) ×2
ELECTRODE REM PT RTRN 9FT ADLT (ELECTROSURGICAL) ×1 IMPLANT
EXTRACTOR VACUUM KIWI (MISCELLANEOUS) ×2 IMPLANT
GLOVE BIOGEL PI IND STRL 7.0 (GLOVE) ×2 IMPLANT
GLOVE BIOGEL PI IND STRL 7.5 (GLOVE) ×1 IMPLANT
GLOVE BIOGEL PI INDICATOR 7.0 (GLOVE) ×2
GLOVE BIOGEL PI INDICATOR 7.5 (GLOVE) ×1
GLOVE SKINSENSE NS SZ7.0 (GLOVE) ×1
GLOVE SKINSENSE STRL SZ7.0 (GLOVE) ×1 IMPLANT
GOWN STRL REUS W/ TWL LRG LVL3 (GOWN DISPOSABLE) ×2 IMPLANT
GOWN STRL REUS W/ TWL XL LVL3 (GOWN DISPOSABLE) ×1 IMPLANT
GOWN STRL REUS W/TWL LRG LVL3 (GOWN DISPOSABLE) ×2
GOWN STRL REUS W/TWL XL LVL3 (GOWN DISPOSABLE) ×1
NS IRRIG 1000ML POUR BTL (IV SOLUTION) ×2 IMPLANT
PACK C SECTION WH (CUSTOM PROCEDURE TRAY) ×2 IMPLANT
PAD ABD 7.5X8 STRL (GAUZE/BANDAGES/DRESSINGS) ×2 IMPLANT
PAD OB MATERNITY 4.3X12.25 (PERSONAL CARE ITEMS) ×2 IMPLANT
PAD PREP 24X48 CUFFED NSTRL (MISCELLANEOUS) ×2 IMPLANT
PENCIL SMOKE EVAC W/HOLSTER (ELECTROSURGICAL) ×2 IMPLANT
STRIP CLOSURE SKIN 1/2X4 (GAUZE/BANDAGES/DRESSINGS) ×2 IMPLANT
SUT MNCRL 0 VIOLET CTX 36 (SUTURE) ×3 IMPLANT
SUT MON AB 4-0 PS1 27 (SUTURE) ×2 IMPLANT
SUT MONOCRYL 0 CTX 36 (SUTURE) ×3
SUT PLAIN 2 0 (SUTURE) ×1
SUT PLAIN 2 0 XLH (SUTURE) ×2 IMPLANT
SUT PLAIN ABS 2-0 CT1 27XMFL (SUTURE) ×1 IMPLANT
SUT VIC AB 0 CT1 36 (SUTURE) ×4 IMPLANT
SUT VIC AB 3-0 CT1 27 (SUTURE) ×1
SUT VIC AB 3-0 CT1 TAPERPNT 27 (SUTURE) ×1 IMPLANT
SUT VIC AB 4-0 KS 27 (SUTURE) ×2 IMPLANT
TOWEL OR 17X24 6PK STRL BLUE (TOWEL DISPOSABLE) ×4 IMPLANT
WATER STERILE IRR 1000ML POUR (IV SOLUTION) ×2 IMPLANT

## 2020-11-22 NOTE — Anesthesia Postprocedure Evaluation (Signed)
Anesthesia Post Note  Patient: Angela Taylor  Procedure(s) Performed: CESAREAN SECTION (N/A Abdomen)     Patient location during evaluation: Mother Baby Anesthesia Type: Epidural Level of consciousness: awake and alert Pain management: pain level controlled Vital Signs Assessment: post-procedure vital signs reviewed and stable Respiratory status: spontaneous breathing, nonlabored ventilation and respiratory function stable Cardiovascular status: stable Postop Assessment: no headache, no backache and epidural receding Anesthetic complications: no   No complications documented.  Last Vitals:  Vitals:   11/22/20 2130 11/22/20 2143  BP: 131/89 (!) 135/98  Pulse: 100 99  Resp: (!) 22 (!) 30  Temp: 36.8 C   SpO2: 100% 100%    Last Pain:  Vitals:   11/22/20 2130  TempSrc: Axillary  PainSc:    Pain Goal: Patients Stated Pain Goal: 9 (11/21/20 2000)                 Lorinda Copland

## 2020-11-22 NOTE — Progress Notes (Signed)
Labor Progress Note Angela Taylor is a 30 y.o. G2P0010 at [redacted]w[redacted]d presented for IOL for PEC, IUGR  S:  Comfortable with epidural.  O:  BP 137/86   Pulse 81   Temp 98.1 F (36.7 C) (Oral)   Resp 18   Ht 5\' 1"  (1.549 m)   Wt 65.8 kg   LMP 03/19/2020   SpO2 100%   BMI 27.40 kg/m  EFM: baseline 135 bpm/ mod variability/ no accels/ late, variable decels  Toco/IUPC: q10-12 SVE: Dilation: 4.5 Effacement (%): 60 Cervical Position: Posterior Station: -2 Presentation: Vertex Exam by:: [email or web address], CNM  A/P: 30 y.o. G2P0010 [redacted]w[redacted]d  1. Labor: latent 2. FWB: Cat II 3. Pain: epidural 4. PEC-stable  FHR decels with irregular ctx since AROM. Discussed FHR decels with pt and spouse. Offered to place IUPC and start amnioinfusion to improve FHR and possibly restart Pitocin if successful, although this may be unsuccessful and may necessitate a CS. Also offered for patient to have CS now d/t fetal intolerance. She elects to try amnioinfusion. Dr. [redacted]w[redacted]d updated, and agrees wiht plan.  Vergie Living, CNM 5:30 PM

## 2020-11-22 NOTE — Progress Notes (Signed)
OB Note SVE unchanged at 4-5/50/high. IUPC in place with AI infusing. Category II due to late decels with contractions with UCs q10-67m; pt not on pitocin.    Given continued late decels, no change in cervix and inability to augment labor, I told her I recommend c-section due to arrest of dilation and fetal intolerance in labor. Pt amenable to plan   Cornelia Copa MD Attending Center for [facility] 11/22/2020 Time: [phone]

## 2020-11-22 NOTE — Discharge Summary (Signed)
Postpartum Discharge Summary     Patient Name: Angela Taylor DOB: December 05, 1990 MRN: [id number]  Date of admission: 11/20/2020 Delivery date:11/22/2020  Delivering provider: Aletha Halim  Date of discharge: 11/25/2020  Admitting diagnosis: Indication for care in labor and delivery, antepartum [O75.9] Intrauterine pregnancy: [redacted]w[redacted]d     Secondary diagnosis:  Principal Problem:   Cesarean delivery delivered Active Problems:   IUGR (intrauterine growth restriction) affecting care of mother   Severe preeclampsia, third trimester   Polyhydramnios   Indication for care in labor and delivery, antepartum  Additional problems: as noted above  Discharge diagnosis: Primary Cesarean delivery delivered                                            Post partum procedures:none Augmentation: AROM, Pitocin, Cytotec and IP Foley Complications: None   Hospital course: Induction of Labor With Cesarean Section   30 y.o. yo G2P0010 at [redacted]w[redacted]d was admitted to the hospital 11/20/2020 for induction of labor. Patient had a labor course significant for fetal intolerance of labor with arrest of dilation at 4cm. The patient went for cesarean section due to arrest of dilation (fetal intolerance of labor). Delivery details are as follows: Membrane Rupture Time/Date: 4:11 PM ,11/22/2020   Delivery Method:C-Section, Low Transverse  Details of operation can be found in separate operative Note.  Patient had a postpartum course notable for Magnesium x 24 hours. She was started on Amlodipine 10 and still BPs were high. So begun on Lisinopril 10 mg. BP was still in the 140/90 range, so this was doubled at discharge. She is ambulating, tolerating a regular diet, passing flatus, and urinating well.  Patient is discharged home in stable condition on 11/25/20.      Newborn Data: Birth date:11/22/2020  Birth time:7:51 PM  Gender:Female  Living status:Living  Apgars:8 ,9  Weight:2050 g                                 Magnesium  Sulfate received: Yes: Seizure prophylaxis BMZ received: No Rhophylac:N/A MMR:N/A T-DaP:declined Flu: declined Transfusion:No  Physical exam  Vitals:   11/24/20 1938 11/24/20 2326 11/25/20 0408 11/25/20 0735  BP: (!) 139/95 (!) 140/96 (!) 144/90 (!) 147/91  Pulse: 90 100 66 79  Resp: $Remo'18 18 16   'RKImP$ Temp: 98.9 F (37.2 C) 98.6 F (37 C) 98.4 F (36.9 C) 98.6 F (37 C)  TempSrc: Oral Oral Oral Oral  SpO2: 99% 100% 100% 100%  Weight:      Height:       General: alert, cooperative and no distress Lochia: appropriate Uterine Fundus: firm Incision: Healing well with no significant drainage DVT Evaluation: No evidence of DVT seen on physical exam. Labs: Lab Results  Component Value Date   WBC 13.7 (H) 11/23/2020   HGB 9.8 (L) 11/23/2020   HCT 28.8 (L) 11/23/2020   MCV 98.0 11/23/2020   PLT 219 11/23/2020   CMP Latest Ref Rng & Units 11/22/2020  Glucose 70 - 99 mg/dL 130(H)  BUN 6 - 20 mg/dL 5(L)  Creatinine 0.44 - 1.00 mg/dL 0.50  Sodium 135 - 145 mmol/L 137  Potassium 3.5 - 5.1 mmol/L 3.8  Chloride 98 - 111 mmol/L 107  CO2 22 - 32 mmol/L 19(L)  Calcium 8.9 - 10.3 mg/dL 6.5(L)  Total Protein 6.5 -  8.1 g/dL 6.0(L)  Total Bilirubin 0.3 - 1.2 mg/dL 0.5  Alkaline Phos 38 - 126 U/L 146(H)  AST 15 - 41 U/L 15  ALT 0 - 44 U/L 15   Edinburgh Score: Edinburgh Postnatal Depression Scale Screening Tool 11/22/2020  I have been able to laugh and see the funny side of things. 0  I have looked forward with enjoyment to things. 0  I have blamed myself unnecessarily when things went wrong. 0  I have been anxious or worried for no good reason. 0  I have felt scared or panicky for no good reason. 0  Things have been getting on top of me. 0  I have been so unhappy that I have had difficulty sleeping. 0  I have felt sad or miserable. 0  I have been so unhappy that I have been crying. 0  The thought of harming myself has occurred to me. 0  Edinburgh Postnatal Depression Scale Total 0      After visit meds:  Allergies as of 11/25/2020      Reactions   Penicillins Hives   Did it involve swelling of the face/tongue/throat, SOB, or low BP? No Did it involve sudden or severe rash/hives, skin peeling, or any reaction on the inside of your mouth or nose? No Did you need to seek medical attention at a hospital or doctor's office? No When did it last happen? If all above answers are "NO", may proceed with cephalosporin use.   Pistachio Nut (diagnostic) Itching   Shrimp [shellfish Allergy] Itching      Medication List    TAKE these medications   amLODipine 10 MG tablet Commonly known as: NORVASC Take 1 tablet (10 mg total) by mouth daily. Start taking on: November 26, 2020   lisinopril 20 MG tablet Commonly known as: ZESTRIL Take 0.5 tablets (10 mg total) by mouth daily. Start taking on: November 26, 2020   oxyCODONE-acetaminophen 5-325 MG tablet Commonly known as: PERCOCET/ROXICET Take 1-2 tablets by mouth every 6 (six) hours as needed.   prenatal multivitamin Tabs tablet Take 1 tablet by mouth daily at 12 noon.        Discharge home in stable condition Infant Feeding: breast and bottle Infant Disposition:home with mother Discharge instruction: per After Visit Summary and Postpartum booklet. Activity: Advance as tolerated. Pelvic rest for 6 weeks.  Diet: routine diet Future Appointments: Future Appointments  Date Time Provider [place]  11/28/2020 10:20 AM [place] None  12/01/2020  9:00 AM Lynnea Ferrier, LCSW [facility] None  01/02/2021  9:30 AM Woodroe Mode, MD [place] None   Follow up Visit:  [place] Follow up in 1 week(s).   Contact information: [address] [place] [id number] [phone]             Message sent to [facility] clinic on 11/22/20 to schedule postpartum appt.  Please schedule this patient for a In person postpartum visit in 6  weeks with the following provider: MD. Additional Postpartum F/U:Postpartum Depression checkup, Incision check 1 week and BP check 1 week  High risk pregnancy complicated by: Severe preeclampsia, polyhydramnios, IUGR Delivery mode:  C-Section, Low Transverse  Anticipated Birth Control:  Condoms   11/25/2020 Donnamae Jude, MD

## 2020-11-22 NOTE — Lactation Note (Signed)
This note was copied from a baby's chart. Lactation Consultation Note  Patient Name: Angela Taylor [id number] Date: 11/22/2020 Reason for consult: Other (Comment);1st time breastfeeding;Late-preterm 34-36.6wks (LC requested in PACU to assist with latch) Age:28 hours  Mom hx: pre-eclampsia, on mag and infant less than 5 lbs ( IUGR).  P1, LPTI in PACU, LC was called to discussed breastfeeding, infant latch and LPTI feeding policy.  LC entered PACU infant was at end of a  Feeding, LC did not assist with latch , per RN,  Infant  BF for 10 minutes. LC discussed hand expression and mom expressed 4 mls of colostrum that was spoon feed to infant. Infant will have blood glucose level drawn in few hours. LC briefly discussed LPTI  Infant feeding policy and infant would need be supplemented with EBM first and then possibly donor breast milk or formula after each feeding due infant's size which is  less than 5 lbs at birth. LC discussed LPTI policy with parents, RN will review  LPTI feeding policy once mom  is on OB unit. LC set up DEBP in mom's room,  Written LC LPTI policy on white board with  and procedures for supplementation. RN will review LPTI policy and need to supplement infant with EBM then donor breast milk or formula. Mom knows to pump every 3 hours for 15 minutes on initial setting, RN will review this with parents. Mom knows to call RN or LC, if she has questions, concerns or needs assistance with latching infant at the breast.  Maternal Data Has patient been taught Hand Expression?: Yes  Feeding Mother's Current Feeding Choice: Breast Milk and Donor Milk  LATCH Score Latch: Repeated attempts needed to sustain latch, nipple held in mouth throughout feeding, stimulation needed to elicit sucking reflex.  Audible Swallowing: A few with stimulation  Type of Nipple: Everted at rest and after stimulation  Comfort (Breast/Nipple): Soft / non-tender  Hold (Positioning): Assistance needed to  correctly position infant at breast and maintain latch.  LATCH Score: 7 done by RN, prior to [facility] entering PACU.   Lactation Tools Discussed/Used Tools: Pump Breast pump type: Double-Electric Breast Pump Pump Education: Setup, frequency, and cleaning;Milk Storage Reason for Pumping: Help with breast stimulation, establish mom's milk supply, C/S delivery LPTI Pumping frequency: MOm understands to pump every 3 hours for 15 minutes , RN with discussed with mom, LC set up pump in room, mom needs 27 mm breast flange  Interventions Interventions: Skin to skin;Breast feeding basics reviewed;Hand express;Expressed milk;DEBP;Education  Discharge Pump: DEBP;Personal  Consult Status Consult Status: Follow-up Date: 11/23/20 Follow-up type: In-patient    Danelle Earthly 11/22/2020, 9:58 PM

## 2020-11-22 NOTE — Transfer of Care (Signed)
Immediate Anesthesia Transfer of Care Note  Patient: Angela Taylor  Procedure(s) Performed: CESAREAN SECTION (N/A Abdomen)  Patient Location: PACU  Anesthesia Type:Epidural  Level of Consciousness: awake, alert  and oriented  Airway & Oxygen Therapy: Patient Spontanous Breathing  Post-op Assessment: Report given to RN and Post -op Vital signs reviewed and stable  Post vital signs: Reviewed and stable  Last Vitals:  Vitals Value Taken Time  BP    Temp    Pulse    Resp    SpO2      Last Pain:  Vitals:   11/22/20 1901  TempSrc:   PainSc: 0-No pain      Patients Stated Pain Goal: 9 (11/21/20 2000)  Complications: No complications documented.

## 2020-11-22 NOTE — Anesthesia Procedure Notes (Signed)
Epidural Patient location during procedure: OB Start time: 11/22/2020 2:54 PM End time: 11/22/2020 3:58 PM  Staffing Anesthesiologist: Bethena Midget, MD  Preanesthetic Checklist Completed: patient identified, IV checked, site marked, risks and benefits discussed, surgical consent, monitors and equipment checked, pre-op evaluation and timeout performed  Epidural Patient position: sitting Prep: DuraPrep and site prepped and draped Patient monitoring: continuous pulse ox and blood pressure Approach: midline Location: L3-L4 Injection technique: LOR air  Needle:  Needle type: Tuohy  Needle gauge: 17 G Needle length: 9 cm and 9 Needle insertion depth: 5 cm cm Catheter type: closed end flexible Catheter size: 19 Gauge Catheter at skin depth: 10 cm Test dose: negative  Assessment Events: blood not aspirated, injection not painful, no injection resistance, no paresthesia and negative IV test

## 2020-11-22 NOTE — Progress Notes (Signed)
FHR with late and variable decels despite >1 hr of amnioinfusion. Dr. Vergie Living notified and will come assess.

## 2020-11-22 NOTE — Progress Notes (Signed)
Labor Progress Note Angela Taylor is a 30 y.o. G2P0010 at [redacted]w[redacted]d presented for IOL for PEC w/SF, IUGR  S:  Comfortable, feeling occasional ctx.  O:  BP (!) 149/102   Pulse 94   Temp 97.9 F (36.6 C) (Oral)   Resp 18   Ht 5\' 1"  (1.549 m)   Wt 65.8 kg   LMP 03/19/2020   SpO2 100%   BMI 27.40 kg/m  EFM: baseline 135 bpm/ mod variability/ no accels/ late decels  Toco/IUPC: 3-6 SVE: Dilation: 4.5 Effacement (%): 80,90 Cervical Position: Posterior Station: -2 Presentation: Vertex Exam by:: Quin Lofton, RN Pitocin: 6 mu/min  A/P: 30 y.o. G2P0010 [redacted]w[redacted]d  1. Labor: latent 2. FWB: Cat II 3. Pain: analgesia/anesthesia prn 4. PEC: stable 5. IUGR 6. Polyhydramnios  Late decelerations despite reduction of Pitocin and fluid bolus. Consult wit Dr. [redacted]w[redacted]d. Plan to d/c Pitocin, await FHR recovery then AROM and if baby tolerates that then consider restarting Pitocin. Discussed recommendation with pt and spouse. They are aware if baby doesn't tolerate either AROM or Pitocin then CS may be necessary. She is considering epidural prior to AROM. Unclear MOD.   Vergie Living, CNM 2:58 PM

## 2020-11-22 NOTE — Anesthesia Preprocedure Evaluation (Signed)
Anesthesia Evaluation  Patient identified by MRN, date of birth, ID band Patient awake    Reviewed: Allergy & Precautions, H&P , NPO status , Patient's Chart, lab work & pertinent test results, reviewed documented beta blocker date and time   Airway Mallampati: II  TM Distance: >3 FB Neck ROM: full    Dental no notable dental hx. (+) Teeth Intact, Dental Advisory Given   Pulmonary neg pulmonary ROS, former smoker,    Pulmonary exam normal breath sounds clear to auscultation       Cardiovascular hypertension, Pt. on medications and Pt. on home beta blockers Normal cardiovascular exam Rhythm:regular Rate:Normal     Neuro/Psych negative neurological ROS  negative psych ROS   GI/Hepatic negative GI ROS, Neg liver ROS,   Endo/Other  negative endocrine ROS  Renal/GU negative Renal ROS  negative genitourinary   Musculoskeletal   Abdominal   Peds  Hematology negative hematology ROS (+)   Anesthesia Other Findings   Reproductive/Obstetrics (+) Pregnancy                             Anesthesia Physical Anesthesia Plan  ASA: III  Anesthesia Plan: Epidural   Post-op Pain Management:    Induction:   PONV Risk Score and Plan:   Airway Management Planned: Oral ETT and LMA  Additional Equipment:   Intra-op Plan:   Post-operative Plan: Extubation in OR  Informed Consent: I have reviewed the patients History and Physical, chart, labs and discussed the procedure including the risks, benefits and alternatives for the proposed anesthesia with the patient or authorized representative who has indicated his/her understanding and acceptance.     Dental advisory given  Plan Discussed with:   Anesthesia Plan Comments: (Discussed both nerve block for pain relief post-op and GA; including NV, sore throat, dental injury, and pulmonary complications)        Anesthesia Quick Evaluation

## 2020-11-22 NOTE — Op Note (Signed)
Operative Note   SURGERY DATE: 11/22/2020  PRE-OP DIAGNOSIS:  *Pregnancy at 35/3 *Severe pre-eclampsia (BPs) *Failed induction *Fetal intolerance of labor at 4-5cm *Fetal growth restriction *Polyhydramnios  POST-OP DIAGNOSIS: Same. Delivered   PROCEDURE: primary low transverse cesarean section via pfannenstiel skin incision with double layer uterine closure  SURGEON: Surgeon(s) and Role:    *  Bing, MD - Primary  ASSISTANT:    Sheila Oats, MD - Fellow  ANESTHESIA: epidural  ESTIMATED BLOOD LOSS:  DRAINS: per anesthesia note  TOTAL IV FLUIDS: per anesthesia note  VTE PROPHYLAXIS: SCDs to bilateral lower extremities  ANTIBIOTICS: Two grams of Cefazolin and azithromycin 500mg  IV x 1 were given., within 1 hour of skin incision.  Clindamycin and Gentamycin were ordered in the computer, but during the time out, the CRNA stated she had given the patient Ancef 2gm and the patient did not have any issues with it. I confirmed after surgery that Clindamycin and Gentamycin were ordered.   SPECIMENS: placenta to pathology  COMPLICATIONS: none  FINDINGS: No intra-abdominal adhesions were noted. Grossly normal uterus, tubes and ovaries. Clear amniotic fluid, cephalic (not engaged), female infant, weight 2050gm, APGARs 8/9, intact placenta.  PROCEDURE IN DETAIL: The patient was taken to the operating room where anesthesia was administered and normal fetal heart tones were confirmed. She was then prepped and draped in the normal fashion in the dorsal supine position with a leftward tilt.  After a time out was performed, a pfannensteil skin incision was made with the scalpel and carried through to the underlying layer of fascia. The fascia was then incised at the midline and this incision was extended laterally with the mayo scissors. Attention was turned to the superior aspect of the fascial incision which was grasped with the kocher clamps x 2, tented up and the rectus  muscles were dissected off with the bovie. In a similar fashion the inferior aspect of the fascial incision was grasped with the kocher clamps, tented up and the rectus muscles dissected off with the mayo scissors. The rectus muscles were then separated in the midline and the peritoneum was entered bluntly. The bladder blade was inserted and the vesicouterine peritoneum was identified, tented up and entered with the metzenbaum scissors. This incision was extended laterally and the bladder flap was created digitally. The bladder blade was reinserted.  A low transverse hysterotomy was made with the scalpel until the endometrial cavity was breached and the amniotic sac ruptured with the Allis clamp, yielding clear amniotic fluid. This incision was extended bluntly and the infant's head, shoulders and body were delivered atraumatically.The cord was clamped x 2 and cut, and the infant was handed to the awaiting pediatricians, after delayed cord clamping was done.  The placenta was then gradually expressed from the uterus and then the uterus was exteriorized and cleared of all clots and debris. The hysterotomy was repaired with a running suture of 1-0 monocyrl. A second imbricating layer of 1-0 monocryl suture was then placed one figure-of-eight sutures of 1-0 monocryl was added to achieve excellent hemostasis.   The uterus and adnexa were then returned to the abdomen, and the hysterotomy and all operative sites were reinspected and excellent hemostasis was noted after irrigation and suction of the abdomen with warm saline.  The fascia was reapproximated with 0 Vicryl in a simple running fashion bilaterally. The subcutaneous layer was then reapproximated with interrupted sutures of 2-0 plain gut, and the skin was then closed with 4-0 monocryl, in a  subcuticular fashion.  The patient  tolerated the procedure well. Sponge, lap, needle, and instrument counts were correct x 2. The patient was transferred to the  recovery room awake, alert and breathing independently in stable condition.  Cornelia Copa MD Attending Center for [facility])

## 2020-11-22 NOTE — Progress Notes (Signed)
Patient ID: Angela Taylor, female   DOB: 11-24-1990, 30 y.o.   MRN: [id number] Labor Progress Note [name] is a 30 y.o. G2P0010 at [redacted]w[redacted]d presented for IOL for severe PEC. S: Resting comfortably, breathing through contractions. Patient denies HA, vision change or CP.  O:  BP (!) 142/84   Pulse (!) 103   Temp 98.8 F (37.1 C) (Oral)   Resp 17   Ht 5\' 1"  (1.549 m)   Wt 65.8 kg   LMP 03/19/2020   SpO2 99%   BMI 27.40 kg/m  EFM: baseline heart rate 130, moderate variability, no accels, no decels Toco: q2-6  CVE: Dilation: 4 Effacement (%): 80 Station: -2 Presentation: Vertex Exam by:: [email or web address], CNM   A&P: 30 y.o. G2P0010 [redacted]w[redacted]d here for IOL for PEC w/ severe features. #Labor: Currently on 4 of pit, will continue to titrate. #Pain: pain meds PRN #FWB: category 1 #GBS unknown, on ppx w/ ancef due to penicillin allergy #PEC w/ SF: on Mg 2gm/hr, good UOP. No HA. BP have been MR, most recently 142/84. Has not required IV antihypertensive. On labetalol 200mg  BID.  [redacted]w[redacted]d, Medical Student 2:25 AM

## 2020-11-22 NOTE — Progress Notes (Signed)
Patient educated on [employer]. RN discussed how to use the IS along with the benefits of opening the lungs, preventing pneumonia, and the benefit of use while on Magnesium Sulfate/Post-surgical. Patient was able to teach back reasons for IS use but stated, "I am not going to use that." When asked, the RN was told to throw the IS in the trash.

## 2020-11-22 NOTE — Progress Notes (Signed)
Labor Progress Note Angela Taylor is a 30 y.o. G2P0010 at [redacted]w[redacted]d presented for IOL for PEC  S:  Comfortable with epidural, no c/o.   O:  BP (!) 136/94   Pulse 90   Temp 98.5 F (36.9 C) (Oral)   Resp 18   Ht 5\' 1"  (1.549 m)   Wt 65.8 kg   LMP 03/19/2020   SpO2 100%   BMI 27.40 kg/m  EFM: baseline 135 bpm/ mod variability/ no accels/ no decels  Toco/IUPC: rare SVE: 4.5/60/-2   A/P: 30 y.o. G2P0010 [redacted]w[redacted]d  1. Labor: latent 2. FWB: Cat I 3. Pain: epidural   Consented for AROM. AROM with large amt of clear fluid.  Will monitor FHR and if reassuring will start Pitocin and consider IUPC placement. Unclear MOD.   [redacted]w[redacted]d, CNM 4:17 PM

## 2020-11-22 NOTE — Progress Notes (Signed)
Labor Progress Note Angela Taylor is a 30 y.o. G2P0010 at [redacted]w[redacted]d presented for IOL for PEC w/SF. Pitocin d/c'd earlier for persistent late decels.   S:  Comfortable, not feeling ctx. Eating breakfast.   O:  BP 140/89   Pulse 87   Temp 99.1 F (37.3 C) (Oral)   Resp 18   Ht 5\' 1"  (1.549 m)   Wt 65.8 kg   LMP 03/19/2020   SpO2 99%   BMI 27.40 kg/m  EFM: baseline 135 bpm/ mod variability/ no accels/ no decels  Toco/IUPC: q5 SVE: Dilation: 4.5 Effacement (%): 80,90 Cervical Position: Posterior Station: -2 Presentation: Vertex Exam by:: Quin Lofton, RN Pitocin: off  A/P: 30 y.o. G2P0010 [redacted]w[redacted]d  1. Labor: latent 2. FWB: Cat I 3. Pain: analgesia/anesthesia prn 4. PEC w/SF, BP stable, on Mg, labs stable  Plan to restart Pitocin. Consider AROM when appropriate. Anticipate labor progress and SVD.  [redacted]w[redacted]d, CNM 10:03 AM

## 2020-11-23 ENCOUNTER — Encounter (HOSPITAL_COMMUNITY): Payer: Self-pay | Admitting: Obstetrics and Gynecology

## 2020-11-23 LAB — CBC
HCT: 28.8 % — ABNORMAL LOW (ref 36.0–46.0)
Hemoglobin: 9.8 g/dL — ABNORMAL LOW (ref 12.0–15.0)
MCH: 33.3 pg (ref 26.0–34.0)
MCHC: 34 g/dL (ref 30.0–36.0)
MCV: 98 fL (ref 80.0–100.0)
Platelets: 219 10*3/uL (ref 150–400)
RBC: 2.94 MIL/uL — ABNORMAL LOW (ref 3.87–5.11)
RDW: 13.2 % (ref 11.5–15.5)
WBC: 13.7 10*3/uL — ABNORMAL HIGH (ref 4.0–10.5)
nRBC: 0 % (ref 0.0–0.2)

## 2020-11-23 MED ORDER — ACETAMINOPHEN 500 MG PO TABS
1000.0000 mg | ORAL_TABLET | Freq: Three times a day (TID) | ORAL | Status: DC
  Administered 2020-11-23 – 2020-11-25 (×7): 1000 mg via ORAL
  Filled 2020-11-23 (×7): qty 2

## 2020-11-23 MED ORDER — FERROUS SULFATE 325 (65 FE) MG PO TABS
325.0000 mg | ORAL_TABLET | ORAL | Status: DC
  Administered 2020-11-23 – 2020-11-25 (×2): 325 mg via ORAL
  Filled 2020-11-23 (×2): qty 1

## 2020-11-23 NOTE — Progress Notes (Signed)
Daily Antepartum Note  Admission Date: 11/20/2020 Current Date: 11/23/2020 7:50 AM  Angela Taylor is a 30 y.o. [id number] POD#1 pLTCS for arrest of dilation and fetal intolerance of labor @ 35/3, admitted for IOL for severe pre-eclampsia (BPs).  Pregnancy complicated by: Patient Active Problem List   Diagnosis Date Noted  . Cesarean delivery delivered 11/22/2020  . Severe preeclampsia, third trimester 11/20/2020  . Polyhydramnios 11/20/2020  . Indication for care in labor and delivery, antepartum 11/20/2020  . Gestational hypertension 11/08/2020  . IUGR (intrauterine growth restriction) affecting care of mother 08/21/2020  . Supervision of high-risk pregnancy 07/23/2020    Overnight/24hr events:  none  Subjective:  Foley just came out. No flatus yet. Pain controlled and taking some po. No s/s of pre-x or mg toxicity  Objective:    Current Vital Signs 24h Vital Sign Ranges  T 98.2 F (36.8 C) Temp  Avg: 98.6 F (37 C)  Min: 97.9 F (36.6 C)  Max: 99.4 F (37.4 C)  BP 132/87 BP  Min: 115/73  Max: 149/102  HR 75 Pulse  Avg: 90.6  Min: 75  Max: 113  RR 19 Resp  Avg: 19.1  Min: 15  Max: 30  SaO2 100 % Room Air SpO2  Avg: 99.6 %  Min: 98 %  Max: 100 %       24 Hour I/O Current Shift I/O  Time Ins Outs 02/05 0701 - 02/06 0700 In: 6898.7 [P.O.:2620; I.V.:4028.7] Out: 6979 [Urine:6305] No intake/output data recorded.   Patient Vitals for the past 24 hrs:  BP Temp Temp src Pulse Resp SpO2  11/23/20 0500 132/87 98.2 F (36.8 C) Oral 75 19 100 %  11/23/20 0110 119/83 -- -- 100 -- --  11/23/20 0108 116/74 -- -- (!) 101 -- --  11/23/20 0106 128/83 -- -- 85 -- --  11/23/20 0102 133/87 -- -- 90 20 --  11/23/20 0016 132/88 99.3 F (37.4 C) Oral 87 20 99 %  11/22/20 2304 133/83 99.4 F (37.4 C) Oral 90 19 100 %  11/22/20 2200 140/89 99.2 F (37.3 C) Oral (!) 103 17 98 %  11/22/20 2143 (!) 135/98 -- -- 99 (!) 30 100 %  11/22/20 2130 131/89 98.2 F (36.8 C) Axillary 100 (!) 22 100  %  11/22/20 2100 (!) 147/97 -- -- 99 (!) 22 100 %  11/22/20 2050 -- -- -- 96 19 100 %  11/22/20 2047 (!) 132/100 -- -- 98 15 100 %  11/22/20 2034 (!) 134/100 98.9 F (37.2 C) -- (!) 113 (!) 21 100 %  11/22/20 1901 (!) 142/95 -- -- (!) 104 17 --  11/22/20 1831 122/68 -- -- 83 16 --  11/22/20 1801 119/70 -- -- 84 17 --  11/22/20 1731 115/73 98.5 F (36.9 C) Oral 84 16 --  11/22/20 1701 137/86 -- -- 81 16 --  11/22/20 1631 135/86 -- -- 85 -- --  11/22/20 1611 -- 98.1 F (36.7 C) Oral -- -- --  11/22/20 1600 (!) 137/92 -- -- 89 -- 100 %  11/22/20 1541 (!) 136/94 -- -- 90 -- 100 %  11/22/20 1536 140/90 -- -- 86 -- 99 %  11/22/20 1531 129/90 -- -- 86 -- 99 %  11/22/20 1526 (!) 135/92 -- -- 90 -- 99 %  11/22/20 1521 135/85 -- -- 87 -- 99 %  11/22/20 1517 137/87 98.5 F (36.9 C) Oral 88 -- 100 %  11/22/20 1511 (!) 144/94 -- -- 93 -- 99 %  11/22/20 1506 (!) 143/82 -- -- 96 -- 100 %  11/22/20 1503 (!) 140/95 -- -- 94 -- 100 %  11/22/20 1500 (!) 141/93 -- -- 91 -- --  11/22/20 1456 -- -- -- -- -- 100 %  11/22/20 1455 (!) 149/102 -- -- 94 18 --  11/22/20 1430 137/89 -- -- 91 -- 100 %  11/22/20 1331 (!) 142/99 -- -- (!) 103 -- --  11/22/20 1301 (!) 136/91 -- -- 87 -- --  11/22/20 1231 134/89 -- -- 88 -- --  11/22/20 1201 125/81 -- -- 87 -- --  11/22/20 1131 138/89 -- -- 82 -- --  11/22/20 1101 130/84 -- -- 80 -- --  11/22/20 1031 134/88 97.9 F (36.6 C) Oral 83 -- --  11/22/20 1001 140/89 -- -- 87 -- --  11/22/20 0946 134/84 -- -- 76 -- --  11/22/20 0901 (!) 148/105 -- -- 95 -- --  11/22/20 0831 (!) 137/96 -- -- 84 -- --  11/22/20 0803 (!) 145/96 -- -- 91 -- --    Physical exam: General: Well nourished, well developed female in no acute distress. Abdomen: rare BS, mild to moderately distended. approp ttp Cardiovascular: S1, S2 normal, no murmur, rub or gallop, regular rate and rhythm Respiratory: CTAB Extremities: no clubbing, cyanosis or edema Skin: Warm and dry.    Medications: Current Facility-Administered Medications  Medication Dose Route Frequency Provider Last Rate Last Admin  . amLODipine (NORVASC) tablet 10 mg  10 mg Oral Daily [place] Bing, MD      . coconut oil  1 application Topical PRN [place] Bing, MD      . witch hazel-glycerin (TUCKS) pad 1 application  1 application Topical PRN [place] Bing, MD       And  . dibucaine (NUPERCAINAL) 1 % rectal ointment 1 application  1 application Rectal PRN [place] Bing, MD      . diphenhydrAMINE (BENADRYL) capsule 25 mg  25 mg Oral Q6H PRN [place] Bing, MD      . enoxaparin (LOVENOX) injection 40 mg  40 mg Subcutaneous Q24H [place] Bing, MD      . gabapentin (NEURONTIN) capsule 100 mg  100 mg Oral BID [place] Bing, MD      . labetalol (NORMODYNE) injection 20 mg  20 mg Intravenous PRN [place] Bing, MD   20 mg at 11/20/20 1442   And  . labetalol (NORMODYNE) injection 40 mg  40 mg Intravenous PRN [place] Bing, MD   40 mg at 11/20/20 1504   And  . labetalol (NORMODYNE) injection 80 mg  80 mg Intravenous PRN [place] Bing, MD       And  . hydrALAZINE (APRESOLINE) injection 10 mg  10 mg Intravenous PRN [place] Bing, MD      . labetalol (NORMODYNE) injection 20 mg  20 mg Intravenous PRN [place] Bing, MD       And  . labetalol (NORMODYNE) injection 40 mg  40 mg Intravenous PRN [place] Bing, MD       And  . labetalol (NORMODYNE) injection 80 mg  80 mg Intravenous PRN [place] Bing, MD       And  . hydrALAZINE (APRESOLINE) injection 10 mg  10 mg Intravenous PRN [place] Bing, MD      . ibuprofen (ADVIL) tablet 800 mg  800 mg Oral Q8H [place] Bing, MD   800 mg at 11/23/20 0502  . lactated ringers infusion   Intravenous Continuous [place] Bing, MD 75 mL/hr at 11/23/20 0003 New Bag at  11/23/20 0003  . magnesium sulfate 40 grams in SWI 1000 mL OB infusion  2 g/hr Intravenous Continuous [place] Bing, MD 50 mL/hr at 11/23/20 0015 2 g/hr at  11/23/20 0015  . menthol-cetylpyridinium (CEPACOL) lozenge 3 mg  1 lozenge Oral Q2H PRN [place] Bing, MD      . oxyCODONE (Oxy IR/ROXICODONE) immediate release tablet 5-10 mg  5-10 mg Oral Q4H PRN [place] Bing, MD      . polyethylene glycol (MIRALAX / GLYCOLAX) packet 17 g  17 g Oral Q lunch [place] Bing, MD      . prenatal multivitamin tablet 1 tablet  1 tablet Oral Q1200 [place] Bing, MD      . simethicone (MYLICON) chewable tablet 80 mg  80 mg Oral TID Lacie Draft, Billey Gosling, MD      . Tdap Leda Min) injection 0.5 mL  0.5 mL Intramuscular Once [place] Bing, MD        Labs:  Recent Labs  Lab 11/22/20 0913 11/22/20 2035 11/23/20 0518  WBC 13.4* 9.9 13.7*  HGB 12.0 11.2* 9.8*  HCT 36.0 33.1* 28.8*  PLT 242 235 219    Recent Labs  Lab 11/20/20 1322 11/22/20 0913  NA 136 137  K 3.8 3.8  CL 104 107  CO2 22 19*  BUN 9 5*  CREATININE 0.50 0.50  CALCIUM 9.5 6.5*  PROT 6.6 6.0*  BILITOT 0.4 0.5  ALKPHOS 171* 146*  ALT 16 15  AST 16 15  GLUCOSE 71 130*     Radiology:  No new imaging  Assessment & Plan:  Pt doing well *PP: routine care. Follow up void. Follow up on birth control. B POS. Breast. Female. Ask about circ prior to discharge *severe pre-x: mg until early evening. Start on norvasc today *PPx: lovenox *Dispo: likely pod#3  Cornelia Copa MD Attending Center for [facility]) GYN Consult Phone: [phone] (M-F, [phone]) & [phone] (Off hours, weekends, holidays)

## 2020-11-23 NOTE — Lactation Note (Addendum)
This note was copied from a baby's chart. Lactation Consultation Note  Patient Name: Angela Taylor [id number] Date: 11/23/2020 Reason for consult: Follow-up assessment;Primapara;1st time breastfeeding;Infant < 6lbs;Late-preterm 34-36.6wks Age:28 hours GHTN on MgSO4, IUGR  P1 Mom of LPTI and IUGR infant.  Baby 2055 gms (4 lbs 8.3oz) this am, basically the same as birth weight.  Baby has had 2 voids and 1 meconium stool.  Baby had a low CBG at 2 hrs of life <20.  2 CBGs since have been 53 and 55.  Mom has been offering the breast STS, baby has latched 3 times for 15-30 mins.  Baby has been supplemented by curved tip syringe 4 ml colostrum and 10-15 ml of 22 cal formula X 3.  Asked Mom if she was offered donor breast milk and she nodded.  Mom encouraged to let staff assist her to pace bottle feed, but Mom is too worried that baby won't breastfeed.  Explained that supplementation volume would be increasing and for extended period of time.  Mom declined bottle nipple.  Baby's ax temp 96.8 this am.  Baby swaddled in crib.  Mom placed baby back STS and was trying to breastfeed baby.  Baby opened her mouth and Mom trying to push her nipple into baby's mouth.  Verbally assisted Mom to use U hold and when baby latched to nipple only, she sucks 2 times before falling asleep.  Reassured Mom that baby was acting very normal for being <5 lbs and LPTI.    Mom encouraged to pump, Mom states she has tried once.  Mom appears anxious and resistant to education.  Mom repeatedly stating that she knows what to do.    LC noted pump parts in bag hanging off her pump.  Offered to disassemble, wash, rinse and place in bin to air dry.    Mom knows she needs to limit baby's breastfeeding to 20-30 mins and offer supplement every time after baby feeds, or 8-12 times per 24 hrs.  Baby to receive 10 ml increasing to 10-20 ml at 24 hrs old.  Mom not interested in listening to anymore education.  RN aware.    LATCH  Score Latch: Too sleepy or reluctant, no latch achieved, no sucking elicited.  Audible Swallowing: None  Type of Nipple: Everted at rest and after stimulation  Comfort (Breast/Nipple): Soft / non-tender  Hold (Positioning): Assistance needed to correctly position infant at breast and maintain latch.  LATCH Score: 5   Lactation Tools Discussed/Used Tools: Pump;Flanges Flange Size: 27 Breast pump type: Double-Electric Breast Pump  Interventions Interventions: Breast feeding basics reviewed;Skin to skin;Breast massage;Hand express;DEBP  Discharge Pump: Personal (Medela DEBP) WIC Program: No  Consult Status Consult Status: Follow-up Date: 11/24/20 Follow-up type: In-patient    Judee Clara 11/23/2020, 8:29 AM

## 2020-11-24 MED ORDER — LISINOPRIL 10 MG PO TABS
10.0000 mg | ORAL_TABLET | Freq: Every day | ORAL | Status: DC
  Administered 2020-11-25: 10 mg via ORAL
  Filled 2020-11-24: qty 1

## 2020-11-24 MED ORDER — ZOLPIDEM TARTRATE 5 MG PO TABS
5.0000 mg | ORAL_TABLET | Freq: Every evening | ORAL | Status: DC | PRN
  Administered 2020-11-24: 5 mg via ORAL
  Filled 2020-11-24: qty 1

## 2020-11-24 MED ORDER — LISINOPRIL 10 MG PO TABS
5.0000 mg | ORAL_TABLET | Freq: Every day | ORAL | Status: DC
  Administered 2020-11-24: 5 mg via ORAL
  Filled 2020-11-24: qty 1

## 2020-11-24 NOTE — Lactation Note (Signed)
This note was copied from a baby's chart. Lactation Consultation Note  Patient Name: Angela Taylor [id number] Date: 11/24/2020 Reason for consult: Follow-up assessment Age:28 hours  P1 mother whose infant is now 72 hours old.  This is a LPTI at 35+3 weeks with a CGA of 35+5 weeks, weighing < 5 lbs and in the NICU.  Mother has not been pumping consistently.  She thought she needed to be pumping "once a day."  Educated mother on the importance of pumping every 2-3 hours to help stimulate breasts and to help encourage a full milk supply.  Admitted that every 2 hours can be a lot so if every 3 hours would be easier for her I would encourage that.  Offered to begin now and mother agreeable.  Observed mother pumping with the #24 flange size which is appropriate at this time.  Mother was able to demonstrate hand expression prior to pumping.  Suggested she continue to massage and hand express before/after pumping.  Coconut oil with instructions provided.  Provided emotional reassurance and mother feeling more comfortable.  She has a colostrum container for any EBM she obtains with pumping and hand expression.  Asked her to obtain labels from the NICU today; mother will follow through.    Mother has a DEBP for home use.  No support person present at this time.   Maternal Data    Feeding Nipple Type: Nfant Slow Flow (purple)  LATCH Score                    Lactation Tools Discussed/Used Breast pump type: Double-Electric Breast Pump;Manual Pump Education:  (Mother did not need review) Reason for Pumping: NICU baby  Interventions    Discharge Pump: Personal;Manual;DEBP  Consult Status Consult Status: Follow-up Date: 11/25/20 Follow-up type: In-patient    Sheralee Qazi R Yessenia Maillet 11/24/2020, 11:32 AM

## 2020-11-24 NOTE — Progress Notes (Signed)
Patient screened out for psychosocial assessment since none of the following apply: °Psychosocial stressors documented in mother or baby's chart °Gestation less than 32 weeks °Code at delivery  °Infant with anomalies °Please contact the Clinical Social Worker if specific needs arise, by MOB's request, or if MOB scores greater than 9/yes to question 10 on [place] Postpartum Depression Screen. ° °Amoreena Neubert Boyd-Gilyard, MSW, LCSW °Clinical Social Work °[phone] °  °

## 2020-11-24 NOTE — Progress Notes (Addendum)
Subjective: Postpartum Day 2: Cesarean Delivery Patient reports incisional pain, tolerating PO and + flatus.    Objective: Vital signs in last 24 hours: Temp:  [98.3 F (36.8 C)-99.4 F (37.4 C)] 98.5 F (36.9 C) (02/07 0919) Pulse Rate:  [66-93] 66 (02/07 0919) Resp:  [16-20] 20 (02/07 0919) BP: (146-154)/(91-97) 154/97 (02/07 0919) SpO2:  [98 %-100 %] 100 % (02/07 0919)  Physical Exam:  General: alert, cooperative and appears stated age Lochia: appropriate Uterine Fundus: firm Incision: no significant drainage DVT Evaluation: No evidence of DVT seen on physical exam.  Recent Labs    11/22/20 2035 11/23/20 0518  HGB 11.2* 9.8*  HCT 33.1* 28.8*    Assessment/Plan: Status post Cesarean section. Postoperative course complicated by elevated BP, on Norvasc 10--will add Lisinopril 10 to help control  Continue current care. Likely home tomorrow Breast + bottle Condoms and NFP Outpatient circumcision  Reva Bores 11/24/2020, 12:20 PM

## 2020-11-25 ENCOUNTER — Other Ambulatory Visit (HOSPITAL_COMMUNITY): Payer: Self-pay | Admitting: Family Medicine

## 2020-11-25 LAB — SURGICAL PATHOLOGY

## 2020-11-25 MED ORDER — LISINOPRIL 20 MG PO TABS: 10.0000 mg | ORAL_TABLET | Freq: Every day | ORAL | 0 refills | Status: DC

## 2020-11-25 MED ORDER — MAGNESIUM SULFATE 40 GM/1000ML IV SOLN
INTRAVENOUS | Status: DC | PRN
  Administered 2020-11-22: 2 g via INTRAVENOUS

## 2020-11-25 MED ORDER — OXYCODONE-ACETAMINOPHEN 5-325 MG PO TABS: 1.0000 | ORAL_TABLET | Freq: Four times a day (QID) | ORAL | 0 refills | Status: DC | PRN

## 2020-11-25 MED ORDER — OXYTOCIN-SODIUM CHLORIDE 30-0.9 UT/500ML-% IV SOLN
INTRAVENOUS | Status: DC | PRN
  Administered 2020-11-22: 30 [IU] via INTRAVENOUS

## 2020-11-25 MED ORDER — AMLODIPINE BESYLATE 10 MG PO TABS: 10.0000 mg | ORAL_TABLET | Freq: Every day | ORAL | 1 refills | Status: DC

## 2020-11-25 MED ORDER — MAGNESIUM SULFATE 50 % IJ SOLN: INTRAMUSCULAR | Status: DC | PRN

## 2020-11-25 MED FILL — LISINOPRIL 20 MG TABLET: 20 | 60 days supply | Qty: 30 | Fill #0

## 2020-11-25 MED FILL — AMLODIPINE BESYLATE 10 MG T: 10 | 30 days supply | Qty: 30 | Fill #0

## 2020-11-25 MED FILL — OXYCODONE-APAP 5-325MG: 5-325 | 4 days supply | Qty: 24 | Fill #0

## 2020-11-25 NOTE — Addendum Note (Signed)
Addendum  created 11/25/20 0617 by Armanda Heritage, CRNA   Intraprocedure Meds edited

## 2020-11-25 NOTE — Lactation Note (Signed)
This note was copied from a baby's chart. Lactation Consultation Note  Patient Name: Angela Taylor [id number] Date: 11/25/2020 Reason for consult: NICU baby;Follow-up assessment;Late-preterm 34-36.6wks Age:28 hours  LC visited mother in her patient room. She will d/c today and relocate to her infant's room in NICU. Mother has increased the frequency of pumping and finds that using an app on her phone is useful for remembering to pump. We reviewed the importance of pumping frequently. Mother has not attempted to bf yet but is aware that she can if she so desires. LC offered to assist prn. Patient was provided with the opportunity to ask questions. All concerns were addressed.  Will plan follow up visit in NICU.  Feeding Mother's Current Feeding Choice: Breast Milk and Donor Milk Nipple Type: Nfant Slow Flow (purple)  Discharge Discharge Education: Engorgement and breast care  Consult Status Consult Status: Follow-up Follow-up type: In-patient    Elder Negus, MA IBCLC 11/25/2020, 11:19 AM

## 2020-11-25 NOTE — Discharge Instructions (Signed)
Postpartum Hypertension Postpartum hypertension is high blood pressure that is higher than normal after childbirth. It usually starts within 1 to 2 days after delivery, but it can happen at any time for up to 6 weeks after delivery. For some women, medical treatment is required to prevent serious complications, such as seizures or stroke. What are the causes? The cause of this condition is not well understood. In some cases, the cause may not be known. Certain conditions may increase your risk. These include:  Hypertension that existed before pregnancy (chronic hypertension).  Hypertension that comes as a result of pregnancy (gestational hypertension).  Hypertensive disorders during pregnancy or seizures in women who have high blood pressure during pregnancy. These conditions are called preeclampsia and eclampsia.  A condition in which the liver, platelets, and red blood cells are damaged during pregnancy (HELLP syndrome).  Obesity.  Diabetes. What are the signs or symptoms? As with all types of hypertension, postpartum hypertension may not have any symptoms. Depending on how high your blood pressure is, you may experience:  Headaches. These may be mild, moderate, or severe. They may also be steady, constant, or sudden in onset (thunderclap headache).  Vision changes, such as blurry vision, flashing lights, or seeing spots.  Nausea and vomiting.  Pain in the upper right side of your abdomen.  Shortness of breath.  Difficulty breathing while lying down.  A decrease in the amount of urine that you pass. How is this diagnosed? This condition may be diagnosed based on the results of a physical exam, blood pressure measurements, and blood and urine tests. You may also have other tests, such as a CT scan or an MRI, to check for other problems of postpartum hypertension. How is this treated? If blood pressure is high enough to require treatment, your options may include:  Medicines to  reduce blood pressure (antihypertensives). Tell your health care provider if you are breastfeeding or if you plan to breastfeed. There are many antihypertensive medicines that are safe to take while breastfeeding.  Treating medical conditions that are causing hypertension.  Treating the complications of hypertension, such as seizures, stroke, or kidney problems. Your health care provider will also continue to monitor your blood pressure closely until it is within a safe range for you. Follow these instructions at home: Learn your goal blood pressure Two numbers make up your blood pressure. The first number is called systolic pressure. The second is called diastolic pressure. An example of a blood pressure reading is "120 over 80" (or 120/80). For most people, goal blood pressure is:  First number: below 140.  Second number: below 90. Your blood pressure is above normal even if only the top or bottom number is above normal. Know what to do before you take your blood pressure 30 minutes before you check your blood pressure:  Do not drink caffeine.  Do not drink alcohol.  Avoid food and drink.  Do not smoke.  Do not exercise. 5 minutes before you check your blood pressure:  Use the bathroom and urinate so that you have an empty bladder.  Sit quietly in a dining room chair. Do not sit in a soft couch or an armchair. Do not talk. Know how to take your blood pressure To check your blood pressure, follow the instructions in the manual that came with your blood pressure monitor. If you have a digital blood pressure monitor, the instructions may be as follows: 1. Sit up straight. 2. Place your feet on the floor. Do   not cross your ankles or legs. 3. Rest your left arm at the level of your heart. You may rest it on a table, desk, or chair. 4. Pull up your shirt sleeve. 5. Wrap the blood pressure cuff around the upper part of your left arm. The cuff should be 1 inch (2.5 cm) above your  elbow. It is best to wrap the cuff around bare skin. 6. Fit the cuff snugly around your arm. You should be able to place only one finger between the cuff and your arm. 7. Put the cord inside the groove of your elbow. 8. Press the power button. 9. Sit quietly while the cuff fills with air and loses air. 10. Write down the numbers on the screen. These are your blood pressure readings. 11. Wait 1-2 minutes and then repeat steps 1-10.   Record your blood pressure readings Follow your health care provider's instructions on how to record your blood pressure readings. If you were asked to use this form, follow these instructions:  Get one reading in the morning (a.m.) before you take any medicines.  Get one reading in the evening (p.m.) before supper.  Take at least 2 readings with each blood pressure check. This makes sure the results are correct. Wait 1-2 minutes between measurements.  Write down the results in the spaces on this form. Date: _______________________  a.m. _____________________(1st reading) _____________________(2nd reading)  p.m. _____________________(1st reading) _____________________(2nd reading) Date: _______________________  a.m. _____________________(1st reading) _____________________(2nd reading)  p.m. _____________________(1st reading) _____________________(2nd reading) Date: _______________________  a.m. _____________________(1st reading) _____________________(2nd reading)  p.m. _____________________(1st reading) _____________________(2nd reading) Date: _______________________  a.m. _____________________(1st reading) _____________________(2nd reading)  p.m. _____________________(1st reading) _____________________(2nd reading) Date: _______________________  a.m. _____________________(1st reading) _____________________(2nd reading)  p.m. _____________________(1st reading) _____________________(2nd reading) General instructions  Take over-the-counter and  prescription medicines only as told by your health care provider.  Do not use any products that contain nicotine or tobacco. These products include cigarettes, chewing tobacco, and vaping devices, such as e-cigarettes. If you need help quitting, ask your health care provider.  Check your blood pressure as often as recommended by your health care provider.  Return to your normal activities as told by your health care provider. Ask your health care provider what activities are safe for you.  Keep all follow-up visits. This is important. Contact a health care provider if:  You have new symptoms, such as: ? A headache that does not get better. ? Dizziness. ? Visual changes. ? Nausea and vomiting. Get help right away if:  You develop difficulty breathing.  You have chest pain.  You faint.  You have any symptoms of a stroke. "BE FAST" is an easy way to remember the main warning signs of a stroke: ? B - Balance. Signs are dizziness, sudden trouble walking, or loss of balance. ? E - Eyes. Signs are trouble seeing or a sudden change in vision. ? F - Face. Signs are sudden weakness or numbness of the face, or the face or eyelid drooping on one side. ? A - Arms. Signs are weakness or numbness in an arm. This happens suddenly and usually on one side of the body. ? S - Speech. Signs are sudden trouble speaking, slurred speech, or trouble understanding what people say. ? T - Time. Time to call emergency services. Write down what time symptoms started.  You have other signs of a stroke, such as: ? A sudden, severe headache with no known cause. ? Nausea or vomiting. ? Seizure. These symptoms   may represent a serious problem that is an emergency. Do not wait to see if the symptoms will go away. Get medical help right away. Call your local emergency services (911 in the [place] Do not drive yourself to the hospital. Summary  Postpartum hypertension is high blood pressure that remains higher than  normal after childbirth.  For some women, medical treatment is required to prevent serious complications, such as seizures or stroke.  Follow your health care provider's instructions on how to record your blood pressure readings.  Keep all follow-up visits. This is important. This information is not intended to replace advice given to you by your health care provider. Make sure you discuss any questions you have with your health care provider. Document Revised: 07/01/2020 Document Reviewed: 07/01/2020 Elsevier Patient Education  2021 [employer] Postpartum Care After Cesarean Delivery This sheet gives you information about how to care for yourself from the time you deliver your baby to up to 6-12 weeks after delivery (postpartum period). Your health care provider may also give you more specific instructions. If you have problems or questions, contact your health care provider. Follow these instructions at home: Medicines  Take over-the-counter and prescription medicines only as told by your health care provider.  If you were prescribed an antibiotic medicine, take it as told by your health care provider. Do not stop taking the antibiotic even if you start to feel better.  Ask your health care provider if the medicine prescribed to you: ? Requires you to avoid driving or using heavy machinery. ? Can cause constipation. You may need to take actions to prevent or treat constipation, such as:  Drink enough fluid to keep your urine pale yellow.  Take over-the-counter or prescription medicines.  Eat foods that are high in fiber, such as beans, whole grains, and fresh fruits and vegetables.  Limit foods that are high in fat and processed sugars, such as fried or sweet foods. Activity  Gradually return to your normal activities as told by your health care provider.  Avoid activities that take a lot of effort and energy (are strenuous) until approved by your health care provider. Walking  at a slow to moderate pace is usually safe. Ask your health care provider what activities are safe for you. ? Do not lift anything that is heavier than your baby or 10 lb (4.5 kg) as told by your health care provider. ? Do not vacuum, climb stairs, or drive a car for as long as told by your health care provider.  If possible, have someone help you at home until you are able to do your usual activities yourself.  Rest as much as possible. Try to rest or take naps while your baby is sleeping. Vaginal bleeding  It is normal to have vaginal bleeding (lochia) after delivery. Wear a sanitary pad to absorb vaginal bleeding and discharge. ? During the first week after delivery, the amount and appearance of lochia is often similar to a menstrual period. ? Over the next few weeks, it will gradually decrease to a dry, yellow-brown discharge. ? For most women, lochia stops completely by 4-6 weeks after delivery. Vaginal bleeding can vary from woman to woman.  Change your sanitary pads frequently. Watch for any changes in your flow, such as: ? A sudden increase in volume. ? A change in color. ? Large blood clots.  If you pass a blood clot, save it and call your health care provider to discuss. Do not flush blood clots down  the toilet before you get instructions from your health care provider.  Do not use tampons or douches until your health care provider says this is safe.  If you are not breastfeeding, your period should return 6-8 weeks after delivery. If you are breastfeeding, your period may return anytime between 8 weeks after delivery and the time that you stop breastfeeding. Perineal care  If your C-section (Cesarean section) was unplanned, and you were allowed to labor and push before delivery, you may have pain, swelling, and discomfort of the tissue between your vaginal opening and your anus (perineum). You may also have an incision in the tissue (episiotomy) or the tissue may have torn during  delivery. Follow these instructions as told by your health care provider: ? Keep your perineum clean and dry as told by your health care provider. Use medicated pads and pain-relieving sprays and creams as directed. ? If you have an episiotomy or vaginal tear, check the area every day for signs of infection. Check for:  Redness, swelling, or pain.  Fluid or blood.  Warmth.  Pus or a bad smell. ? You may be given a squirt bottle to use instead of wiping to clean the perineum area after you go to the bathroom. As you start healing, you may use the squirt bottle before wiping yourself. Make sure to wipe gently. ? To relieve pain caused by an episiotomy, vaginal tear, or hemorrhoids, try taking a warm sitz bath 2-3 times a day. A sitz bath is a warm water bath that is taken while you are sitting down. The water should only come up to your hips and should cover your buttocks.   Breast care  Within the first few days after delivery, your breasts may feel heavy, full, and uncomfortable (breast engorgement). You may also have milk leaking from your breasts. Your health care provider can suggest ways to help relieve breast discomfort. Breast engorgement should go away within a few days.  If you are breastfeeding: ? Wear a bra that supports your breasts and fits you well. ? Keep your nipples clean and dry. Apply creams and ointments as told by your health care provider. ? You may need to use breast pads to absorb milk leakage. ? You may have uterine contractions every time you breastfeed for several weeks after delivery. Uterine contractions help your uterus return to its normal size. ? If you have any problems with breastfeeding, work with your health care provider or a Advertising copywriter.  If you are not breastfeeding: ? Avoid touching your breasts as this can make your breasts produce more milk. ? Wear a well-fitting bra and use cold packs to help with swelling. ? Do not squeeze out (express)  milk. This causes you to make more milk. Intimacy and sexuality  Ask your health care provider when you can engage in sexual activity. This may depend on your: ? Risk of infection. ? Healing rate. ? Comfort and desire to engage in sexual activity.  You are able to get pregnant after delivery, even if you have not had your period. If desired, talk with your health care provider about methods of family planning or birth control (contraception). Lifestyle  Do not use any products that contain nicotine or tobacco, such as cigarettes, e-cigarettes, and chewing tobacco. If you need help quitting, ask your health care provider.  Do not drink alcohol, especially if you are breastfeeding. Eating and drinking  Drink enough fluid to keep your urine pale yellow.  Eat high-fiber  foods every day. These may help prevent or relieve constipation. High-fiber foods include: ? Whole grain cereals and breads. ? Brown rice. ? Beans. ? Fresh fruits and vegetables.  Take your prenatal vitamins until your postpartum checkup or until your health care provider tells you it is okay to stop.   General instructions  Keep all follow-up visits for you and your baby as told by your health care provider. Most women visit their health care provider for a postpartum checkup within the first 3-6 weeks after delivery. Contact a health care provider if you:  Feel unable to cope with the changes that a new baby brings to your life, and these feelings do not go away.  Feel unusually sad or worried.  Have breasts that are painful, hard, or turn red.  Have a fever.  Have trouble holding urine or keeping urine from leaking.  Have little or no interest in activities you used to enjoy.  Have not breastfed at all and you have not had a menstrual period for 12 weeks after delivery.  Have stopped breastfeeding and you have not had a menstrual period for 12 weeks after you stopped breastfeeding.  Have questions about  caring for yourself or your baby.  Pass a blood clot from your vagina. Get help right away if you:  Have chest pain.  Have difficulty breathing.  Have sudden, severe leg pain.  Have severe pain or cramping in your abdomen.  Bleed from your vagina so much that you fill more than one sanitary pad in one hour. Bleeding should not be heavier than your heaviest period.  Develop a severe headache.  Faint.  Have blurred vision or spots in your vision.  Have a bad-smelling vaginal discharge.  Have thoughts about hurting yourself or your baby. If you ever feel like you may hurt yourself or others, or have thoughts about taking your own life, get help right away. You can go to your nearest emergency department or call:  Your local emergency services (911 in the [place]  A suicide crisis helpline, such as the National Suicide Prevention Lifeline at 661-424-7575. This is open 24 hours a day. Summary  The period of time from when you deliver your baby to up to 6-12 weeks after delivery is called the postpartum period.  Gradually return to your normal activities as told by your health care provider.  Keep all follow-up visits for you and your baby as told by your health care provider. This information is not intended to replace advice given to you by your health care provider. Make sure you discuss any questions you have with your health care provider. Document Revised: 05/24/2018 Document Reviewed: 05/24/2018 Elsevier Patient Education  2021 [employer].

## 2020-11-27 ENCOUNTER — Ambulatory Visit: Payer: BC Managed Care – PPO

## 2020-11-27 ENCOUNTER — Other Ambulatory Visit: Payer: BC Managed Care – PPO

## 2020-11-27 ENCOUNTER — Encounter: Payer: BC Managed Care – PPO | Admitting: Obstetrics and Gynecology

## 2020-11-28 ENCOUNTER — Other Ambulatory Visit: Payer: Self-pay

## 2020-11-28 ENCOUNTER — Ambulatory Visit (INDEPENDENT_AMBULATORY_CARE_PROVIDER_SITE_OTHER): Payer: BC Managed Care – PPO | Admitting: *Deleted

## 2020-11-28 NOTE — Progress Notes (Signed)
Subjective:  Angela Taylor is a 30 y.o. female here for BP and incision check. Pt is 6 days post c/s for severe Pre-E.   Hypertension ROS: taking medications as instructed, no medication side effects noted, no TIA's, no chest pain on exertion, no dyspnea on exertion and no swelling of ankles.    Objective:  BP 132/90   Pulse (!) 101   LMP 03/19/2020    General:  alert and cooperative  Abdomen: soft, bowel sounds active, non-tender  Incision:   healing well, no drainage, no erythema, no hernia, no seroma, no swelling, no dehiscence, incision well approximated, steri strips intact    Appearance alert, well appearing, and in no distress.  General exam BP noted to be stable today in office.   Assessment:   Blood Pressure stable and improved.  Doing well postoperatively.  Plan:  Current treatment plan is effective, no change in therapy.  Wound care discussed, advised when to seek care. Keep upcoming PP visit.

## 2020-11-29 ENCOUNTER — Ambulatory Visit: Payer: Self-pay

## 2020-11-29 NOTE — Lactation Note (Signed)
This note was copied from a baby's chart. Lactation Consultation Note  Patient Name: Angela Taylor [id number] Date: 11/29/2020 Reason for consult: Mother's request;Primapara;NICU baby;Late-preterm 34-36.6wks Age:28 days   Mom has been pumping 2-3 times/day and getting about 30 mL. She found pumping on a schedule to be stressful and anxiety-inducing, thus the decreased pumping. I had a conversation with Mom and discussed how if she continues to only pump 2-3 times/day, her milk supply will dwindle.   We talked about how we could not put her on a  "schedule," but, rather say, "aim to pump 6 times/day." Mom was agreeable with that. I did observe Mom pump briefly and the size 24 flanges are still appropriate.   Parents asked for clarification about if it was OK to pool milk together over different pumping sessions. I shared with them the new AAP guidelines (Sept 2021) that says it is ok to pool milk together over a 24-hr time period and the fresh milk does not need to be cooled before adding it to the cold milk.   Parents inquired about Mom's meds: oxycodone, amlodipine, and lisinopril. Mom has been taking 20-30mg  of oxycodone, per day. I shared with parents that could potentially cause issues with feeding and breathing of an infant. Mom immediately said that she didn't feel like she had to take it and she could stop today.  Per [employer], amlodipine is fine to take, but there is no safety data on lisinopril and an alternative is recommended for preterm infants. Per Maisie Fus Hale's "Medications & Mother's Milk", there is no breastfeeding data; "ACE inhibitors in general do not transfer into human milk significantly."   Mom is planning to have a virtual visit with her OB on Monday. I gave her a list of alternative drugs in that same class (as recommended by [employer] & Dr. Sheffield Slider) that she could ask her doctor about. Per parents, Mom is taking a very low dosage: 10 mg qd.   Infant may get to be d/c'd home on  2/14.  Lurline Hare [facility] 11/29/2020, 10:05 AM

## 2020-11-29 NOTE — Lactation Note (Addendum)
This note was copied from a baby's chart. Lactation Consultation Note  Patient Name: Angela Taylor [id number]'H Date: 11/29/2020   Age:29 days  Around 1pm I spoke with Stefani Dama, RPh about Mom's use of lisinopril. Based on the fact that there is an absence of any safety data for lisinopril and infant is premature, it was determined that it would be best not to use mom's EBM. This will also decrease the infant's exposure to Mom's oxycodone usage. I shared the above info with Gilda Crease, NP. She concurred and gave me permission to call the Mom.  I called Mom to let her know that we would need to discard any unused milk in our possession (about 30 mL) and that she would need to pump and dump for 60 hrs after she takes her last lisinopril dosage. Mom was appropriately upset.   Mom mentioned going ahead and stopping her lisinopril today. I suggested that she call her OB office and have the on-call physician call her back so that she could receive guidance on stopping her lisinopril and/or switching to another medication.    Lurline Hare [facility] 11/29/2020, 2:42 PM

## 2020-12-01 ENCOUNTER — Telehealth: Payer: Self-pay

## 2020-12-01 ENCOUNTER — Ambulatory Visit (INDEPENDENT_AMBULATORY_CARE_PROVIDER_SITE_OTHER): Payer: BC Managed Care – PPO | Admitting: Licensed Clinical Social Worker

## 2020-12-01 DIAGNOSIS — F4322 Adjustment disorder with anxiety: Secondary | ICD-10-CM | POA: Diagnosis not present

## 2020-12-01 NOTE — Telephone Encounter (Signed)
Pt called nursing line over the weekend. Pt is concerned about her BP medication she was discharged home with for Pre-E. Pt is taking amlodipine and lisinopril and is concerned because she is breastfeeding. Pt would like to know if this should be changed. Please advise.

## 2020-12-02 NOTE — Telephone Encounter (Signed)
Patient notified that medications were safe to take while breastfeeding. Pt agreed and verbalized understanding.

## 2020-12-03 NOTE — BH Specialist Note (Signed)
Integrated Behavioral Health via Telemedicine Visit  12/03/2020 Angela Taylor [id number]  Number of Integrated Behavioral Health visits: 1/6 Session Start time: 9:00am  Session End time: 9:17am Total time: 17 mins via mychart   Referring Provider: Hospital Discharge  Patient/Family location: Home [facility] Provider location: [facility] All persons participating in visit: Angela Taylor and LCSWA A.Felton Clinton  Types of Service: General Behavioral Integrated Care (BHI)  I connected with Angela Taylor and/or Angela Taylor's n/a by Telephone  (Video is Caregility application) and verified that I am speaking with the correct person using two identifiers.Discussed confidentiality: Yes   I discussed the limitations of telemedicine and the availability of in person appointments.  Discussed there is a possibility of technology failure and discussed alternative modes of communication if that failure occurs.  I discussed that engaging in this telemedicine visit, they consent to the provision of behavioral healthcare and the services will be billed under their insurance.  Patient and/or legal guardian expressed understanding and consented to Telemedicine visit: Yes   Presenting Concerns: Patient and/or family reports the following symptoms/concerns: anxious mood  Duration of problem: approx 3 weeks ; Severity of problem: mild  Patient and/or Family's Strengths/Protective Factors: Concrete supports in place (healthy food, safe environments, etc.)  Goals Addressed: Patient will: 1.  Reduce symptoms of: anxiety  2.  Increase knowledge and/or ability of: coping skills  3.  Demonstrate ability to: Increase healthy adjustment to current life circumstances  Progress towards Goals: Ongoing  Interventions: Interventions utilized:  Supportive Counseling Standardized Assessments completed: Not Needed   Assessment: Patient currently experiencing adjustment disorder with anxious mood   Patient may  benefit from integrated behavioral health   Plan: 1. Follow up with behavioral health clinician on : as needed  2. Behavioral recommendations: prioritize rest, collaborate with lactation nurse regarding breastfeeding concerns.  3. Referral(s): lactation nurse   I discussed the assessment and treatment plan with the patient and/or parent/guardian. They were provided an opportunity to ask questions and all were answered. They agreed with the plan and demonstrated an understanding of the instructions.   They were advised to call back or seek an in-person evaluation if the symptoms worsen or if the condition fails to improve as anticipated.  Gwyndolyn Saxon, LCSW

## 2021-01-02 ENCOUNTER — Ambulatory Visit (INDEPENDENT_AMBULATORY_CARE_PROVIDER_SITE_OTHER): Payer: BC Managed Care – PPO | Admitting: Obstetrics & Gynecology

## 2021-01-02 ENCOUNTER — Other Ambulatory Visit: Payer: Self-pay

## 2021-01-02 ENCOUNTER — Encounter: Payer: Self-pay | Admitting: Obstetrics & Gynecology

## 2021-01-02 VITALS — BP 126/92 | HR 97 | Wt 124.0 lb

## 2021-01-02 DIAGNOSIS — Z98891 History of uterine scar from previous surgery: Secondary | ICD-10-CM

## 2021-01-02 NOTE — Patient Instructions (Signed)
Contraceptive Barrier Methods A barrier method is a type of birth control (contraception) that is used to prevent pregnancy. Barrier methods include:  Female condom.  Female condom.  Diaphragm.  Cervical cap.  Sponge.  Spermicide. Your health care provider can help you decide which form of contraception is best for you. Always keep in mind that the risk of an STI (sexually transmitted infection) exists even when a contraceptive barrier method is used. Female condom A female condom is a thin sheath that is worn over the penis during sex. Condoms prevent pregnancy by catching and stopping sperm from reaching the uterus. They also help to protect against STIs. Some condoms come with a sperm-killing substance (spermicide) on them. Female condoms are made of latex, rubber, or a type of plastic called polyurethane. Condoms that are made of latex and polyurethane provide the best protection against many STIs, including HIV (human immunodeficiency virus). Female condoms can only be worn once and should never be doubled. They should not be used with oil-based lubricants like petroleum jelly, lotions, or oils because these lubricants make them less effective. They can be used with water-based lubricants available from your health care provider and over the counter. Water-based lubricants do not contain silicone, wax, or oil.   Female condom A female condom is a soft, loose-fitting sheath that is put into the vagina before sex. It is held in place by two closed inner rings, one at the cervix-which is the lowest part of the uterus-and the other at the vaginal opening. Female condoms prevent pregnancy by catching sperm and blocking its passage to the uterus. They also help to protect against STIs. A female condom is intended for one-time use only. It can be inserted as many as 8 hours before sex. You should not use a female condom while your partner uses a female condom because the condoms can stick to each other and  break.   Diaphragm A diaphragm is a soft latex or silicone dome-shaped barrier that is placed in the vagina with sperm-killing (spermicidal) jelly before sex. It covers the lowest part of the uterus (cervix), which opens into the vagina. A diaphragm kills sperm and blocks the passage of sperm into the cervix. This method does not protect against STIs (sexually transmitted infections). This barrier method requires a prescription and must be fitted by a health care provider. A diaphragm can be inserted up to 2 hours before sex. If it is inserted more than 2 hours before sex, the spermicide must be applied again. A diaphragm should be left in the vagina for 6-8 hours after sex. Before sex can occur again during these 6-8 hours, spermicide must be reapplied. A diaphragm should not be left in place for longer than 24 hours. If you lose or gain a certain amount of weight, you may need to be refitted by your health care provider. A diaphragm should not be used during your menstrual period.   Cervical cap A cervical cap is a round soft, latex or plastic cup that is put in the vagina and fits over the cervix. It stays in place using suction. A cervical cap should be used with a spermicide. It provides continuous protection as long as it is in place, regardless of how many times you have sex. It does not protect against STIs. Cervical caps may be inserted as long as 6 hours before sexual activity. They must be left in place for at least 6 hours after sex and can be left in place for  as long as 48 hours. A cervical cap must be fitted by a health care provider. If you lose or gain a certain amount of weight, your health care provider may need to refit the cap. A cervical cap should not be used during your menstrual period.   Sponge A sponge is a soft, circular piece of polyurethane foam that has spermicide in it. It is made wet with clean water and then placed into the vagina and over the cervix before sex. The foam is  designed to trap and absorb sperm before it enters the cervix while the spermicide kills or immobilizes sperm. A sponge offers an immediate and continuous presence of spermicide throughout a 24-hour period no matter how many times you have sex. It does not protect against STIs. A sponge should be left in place for at least 6 hours after sex. It should not be left in for more than 24 hours, and it cannot be reused. It has a loop to grab for removal. This barrier method can be purchased over the counter. You may use it if you are breastfeeding. A sponge should not be used during your menstrual period.   Spermicide Spermicides are chemicals that kill or block sperm from entering the cervix and uterus. They are inserted into the vagina with an applicator before sex. Spermicides do not protect against STIs. Spermicides come as creams, jellies, suppositories, foam, film, or tablets. Suppositories, film, and tablets should be inserted 10-30 minutes before sex so they can dissolve. To be effective, a new spermicide must be inserted every time you have sex. Where to find more information  Centers for Disease Control and Prevention: [employer] Summary  A barrier method is a type of birth control that is used to prevent pregnancy.  Your health care provider can help you decide which form of contraception is best for you.  Always keep in mind that the risk of an STI (sexually transmitted infection) exists even when a contraceptive barrier method is used. This information is not intended to replace advice given to you by your health care provider. Make sure you discuss any questions you have with your health care provider. Document Revised: 03/25/2020 Document Reviewed: 03/25/2020 Elsevier Patient Education  2021 [employer].

## 2021-01-02 NOTE — Progress Notes (Signed)
    Post Partum Visit Note  Angela Taylor is a 30 y.o. [id number] female who presents for a postpartum visit. She is 6 weeks postpartum following a primary cesarean section.  I have fully reviewed the prenatal and intrapartum course. The delivery was at 35.3 gestational weeks.  Anesthesia: epidural. Postpartum course has been doing well. Baby is doing well. Baby is feeding by [employer]. Bleeding no bleeding. Bowel function is normal. Bladder function is normal. Patient is sexually active. Contraception method is condoms.   Postpartum depression screening: negative.   The pregnancy intention screening data noted above was reviewed. Potential methods of contraception were discussed. The patient elected to proceed with Female Condom.      The following portions of the patient's history were reviewed and updated as appropriate: allergies, current medications, past family history, past medical history, past social history, past surgical history and problem list.  Review of Systems Pertinent items are noted in HPI.    Objective:  LMP 03/19/2020    General:  alert, cooperative and no distress     Lungs: effort normal  Heart:  normal rate  Abdomen: soft, non-tender; bowel sounds normal; no masses,  no organomegaly and incision dry and intact   Vulva:  not evaluated  Vagina: not evaluated           Rectal Exam: Not performed.        Assessment:    normal postpartum exam. Pap smear not done at today's visit.   Plan:   Essential components of care per ACOG recommendations:  1.  Mood and well being: Patient with negative depression screening today. Reviewed local resources for support.  - Patient does not use tobacco.  - hx of drug use? No    2. Infant care and feeding:  -Patient currently breastmilk feeding? No  -Social determinants of health (SDOH) reviewed in EPIC. No concerns  3. Sexuality, contraception and birth spacing - Patient does not want a pregnancy in the next  year.  Desired family size is 2 children.  - Reviewed forms of contraception in tiered fashion. Patient desired condoms today.   - Discussed birth spacing of 18 months  4. Sleep and fatigue -Encouraged family/partner/community support of 4 hrs of uninterrupted sleep to help with mood and fatigue  5. Physical Recovery  - Discussed patients delivery - Patient had a n/a degree laceration, perineal healing reviewed. Patient expressed understanding - Patient has urinary incontinence? No - Patient is safe to resume physical and sexual activity  6.  Health Maintenance - Last pap smear done 11/2016 and was normal  7. CHTN Chronic Disease - PCP follow up  Adam Phenix, MD  [facility], [facility]

## 2021-07-07 ENCOUNTER — Telehealth: Payer: Self-pay

## 2021-07-07 NOTE — Telephone Encounter (Signed)
Pt called stating that she is having some lower pelvic pain. States she noticed that it started yesterday. Pt could not remember LMP but did know it was last month and should be due for another one soon. Pt denied any urinary symptoms or any unusual vaginal d/c. Pt states she did take some Midol and the pain dissipated. Advised patient pain could be ovulatory depending on when her last cycle was. Advised patient to monitor symptoms. Should she develop anything that becomes worrisome or not controlled with ibuprofen to call back and we could get her in for evaluation. Pt is agreeable to this plan and verbalized understanding.

## 2022-09-16 IMAGING — US US MFM OB FOLLOW-UP
1 series · 13 of 28 positions shown · non-contrast
Comparison: none

[Series 1: us mfm ob follow-up · 42 acquisitions, 13 frames shown]
[im 2/42]
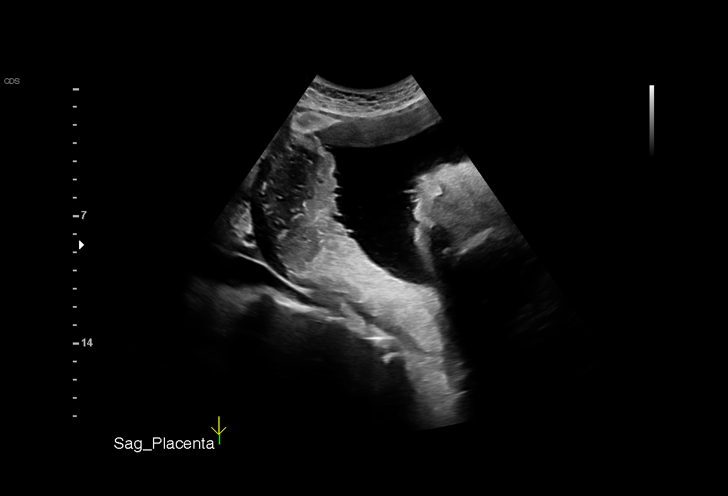
[im 5/42]
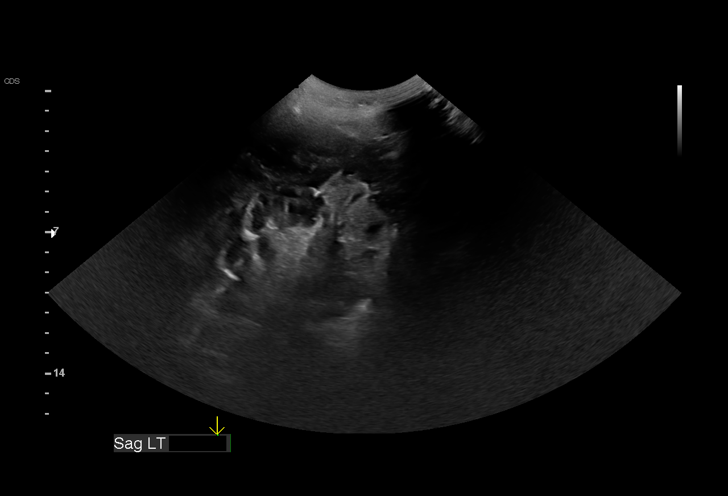
[im 8/42]
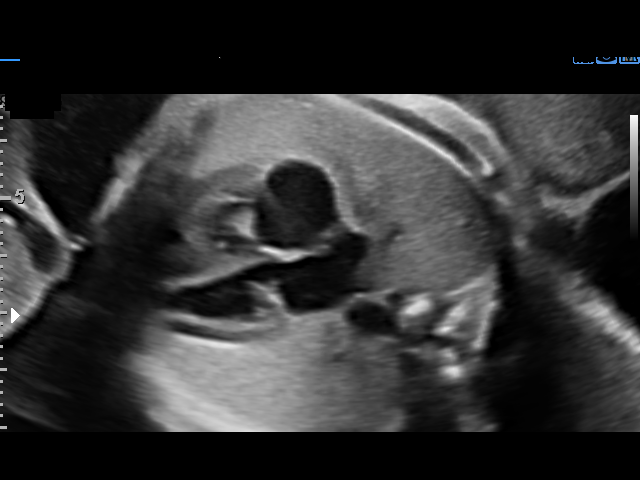
[im 11/42]
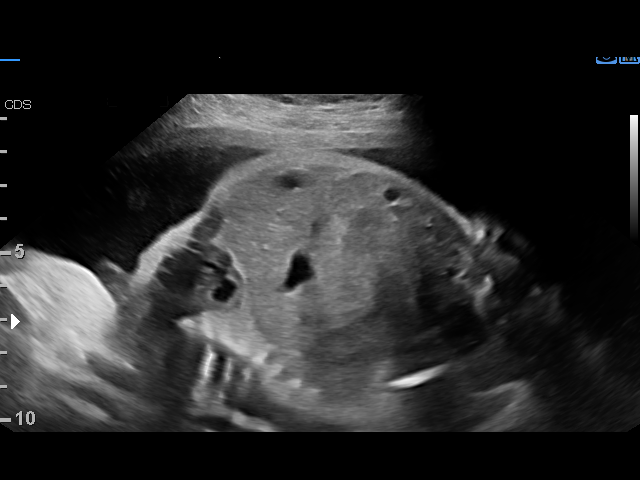
[im 14/42]
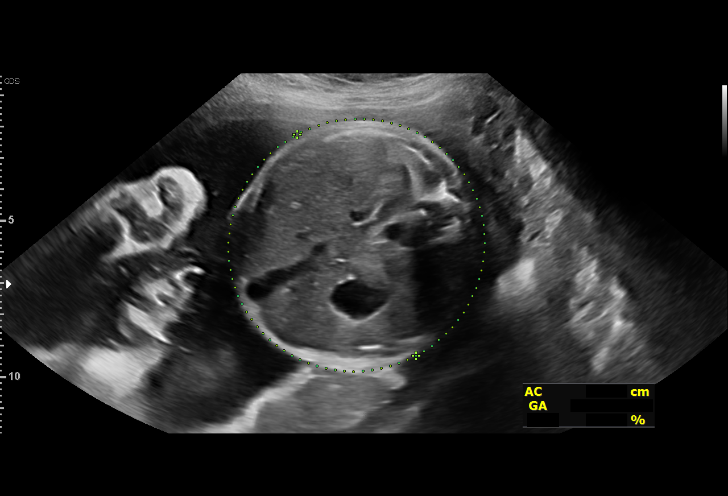
[im 17/42]
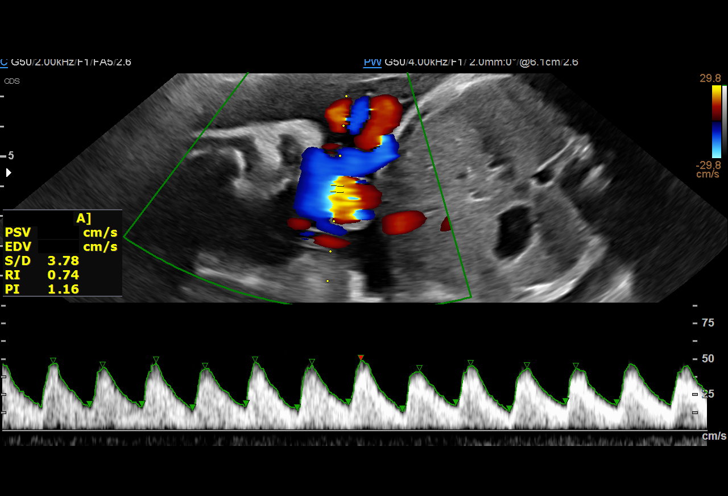
[im 22/42]
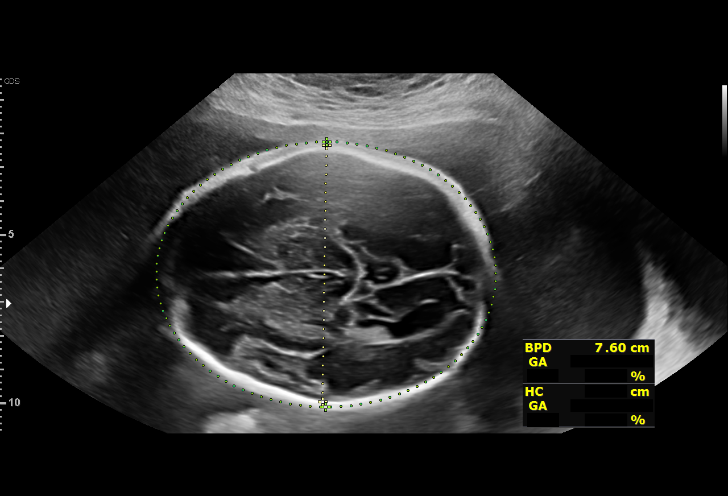
[im 25/42]
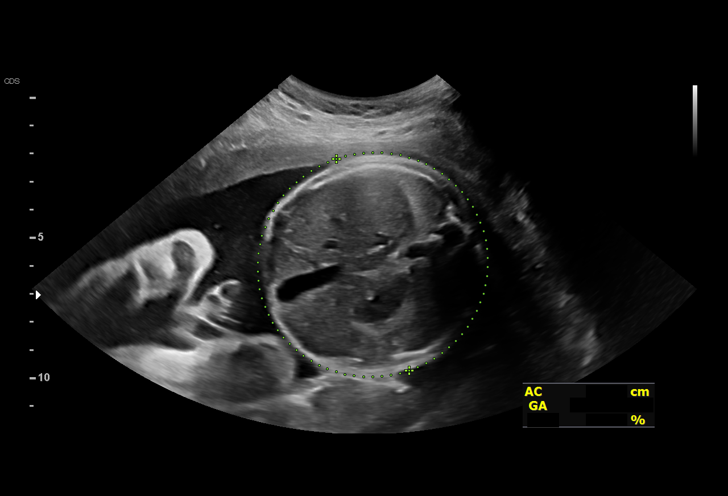
[im 28/42]
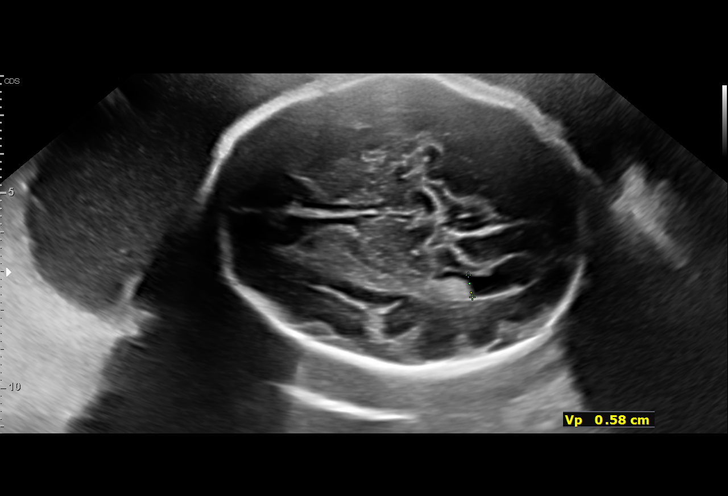
[im 31/42]
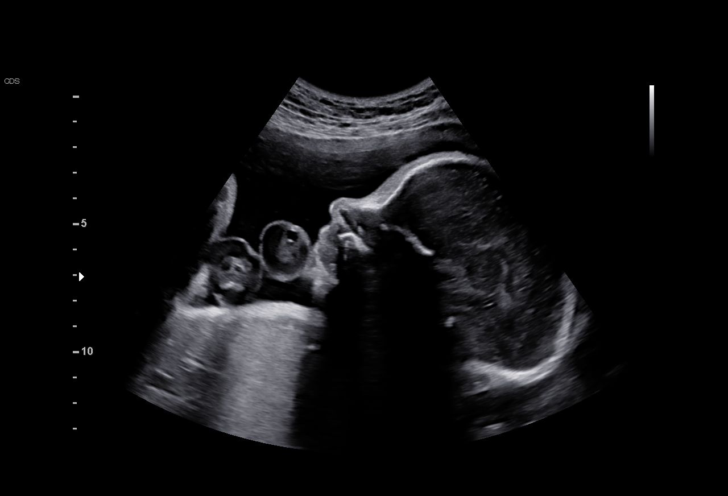
[im 34/42]
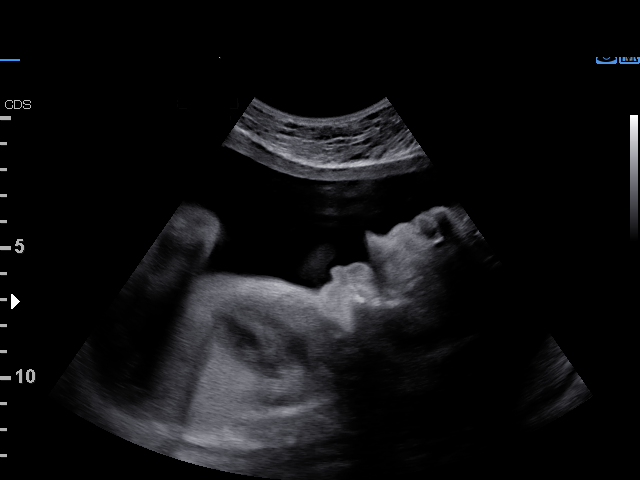
[im 37/42]
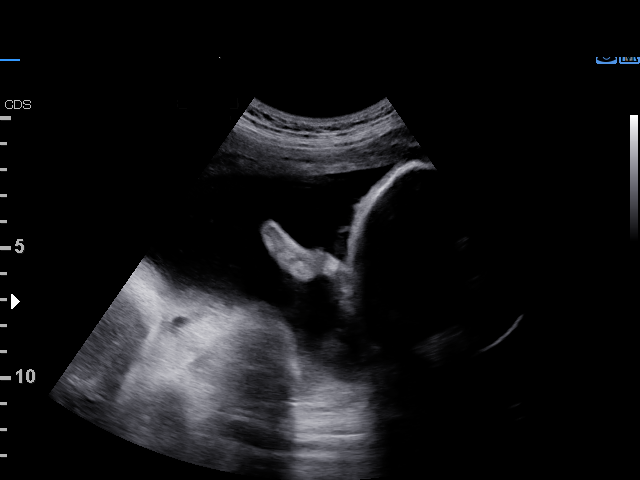
[im 40/42]
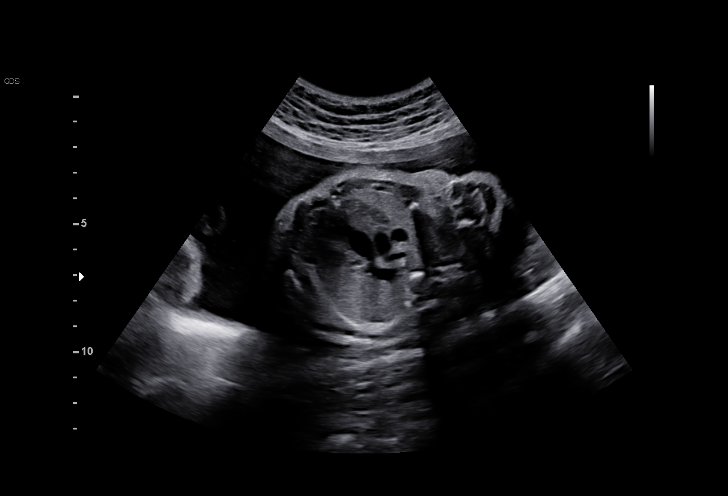

[13 of 28 positions shown; findings below may reference images not displayed]

Indications

 Maternal care for known or suspected poor
 fetal growth, third trimester, fetus 1 IUGR
 30 weeks gestation of pregnancy
 Herpes simplex virus (DOLMA)
 Negative AFP(Negative INVITAE)
Fetal Evaluation

 Num Of Fetuses:         1
 Fetal Heart Rate(bpm):  162
 Cardiac Activity:       Observed
 Presentation:           Transverse, head to maternal left
 Placenta:               Posterior
 P. Cord Insertion:      Previously Visualized

 Amniotic Fluid
 AFI FV:      Within normal limits

 AFI Sum(cm)     %Tile       Largest Pocket(cm)
 14.79           52

 RUQ(cm)       RLQ(cm)       LUQ(cm)        LLQ(cm)

Biometry
 BPD:      75.8  mm     G. Age:  30w 3d         29  %    CI:        72.66   %    70 - 86
                                                         FL/HC:      18.0   %    19.3 -
 HC:      282.8  mm     G. Age:  31w 0d         22  %    HC/AC:      1.10        0.96 -
 AC:      257.1  mm     G. Age:  29w 6d         22  %    FL/BPD:     67.2   %    71 - 87
 FL:       50.9  mm     G. Age:  27w 2d        < 1  %    FL/AC:      19.8   %    20 - 24

 LV:        5.8  mm

 Est. FW:    6841  gm           3 lb      5  %
OB History

 Gravidity:    2
 Living:       0
Gestational Age

 LMP:           30w 5d        Date:  03/19/20                 EDD:   12/24/20
 U/S Today:     29w 5d                                        EDD:   12/31/20
 Best:          30w 5d     Det. By:  LMP  (03/19/20)          EDD:   12/24/20
Anatomy

 Cranium:               Previously seen        LVOT:                   Previously seen
 Cavum:                 Previously seen        Aortic Arch:            Previously seen
 Ventricles:            Appears normal         Ductal Arch:            Previously seen
 Choroid Plexus:        Previously seen        Diaphragm:              Appears normal
 Cerebellum:            Previously seen        Stomach:                Appears normal, left
                                                                       sided
 Posterior Fossa:       Previously seen        Abdomen:                Previously seen
 Nuchal Fold:           Previously seen        Abdominal Wall:         Previously seen
 Face:                  Orbits and profile     Cord Vessels:           Previously seen
                        previously seen
 Lips:                  Previously seen        Kidneys:                Appear normal
 Palate:                Previously seen        Bladder:                Appears normal
 Thoracic:              Previously seen        Spine:                  Previously seen
 Heart:                 Appears normal         Upper Extremities:      Previously seen
 RVOT:                  Previously seen        Lower Extremities:      Previously seen

 Other:  Heels, open hands visualized previously. Male gender previously
         seen.
Doppler - Fetal Vessels

 Umbilical Artery
  S/D     %tile      RI    %tile                             ADFV    RDFV
  3.08       67    0.68       74                                No      No

Cervix Uterus Adnexa

 Cervix
 Not visualized (advanced GA >23wks)
Comments

 This patient was seen for a follow up growth scan due to fetal
 growth restriction noted during her prior ultrasound exams.
 She denies any problems since her last exam and reports
 feeling vigorous fetal movements throughout the day.
 On today's exam, the EFW measures at the 5th percentile for
 her gestational age indicating fetal growth restriction.  The
 fetus has grown 1 pound over the past 3 weeks.  There was
 normal amniotic fluid noted.
 Fetal movements were noted throughout today's ultrasound
 exam.
 Doppler studies of the umbilical arteries showed a normal
 S/D ratio of 3.08.  There were no signs of absent or reversed
 end-diastolic flow.
 Due to fetal growth restriction, we will continue to follow her
 with weekly fetal testing and umbilical artery Doppler studies.
 A biophysical profile and umbilical artery Doppler study was
 scheduled in 1 week.
 We will reassess the fetal growth again in 3 weeks.
